# Patient Record
Sex: Male | Born: 1955 | Race: White | Hispanic: No | Marital: Married | Smoking: Former smoker
Health system: Southern US, Community
[De-identification: ages and names within clinical notes are randomized; demographics above are authoritative.]

## PROBLEM LIST (undated history)

## (undated) DIAGNOSIS — I739 Peripheral vascular disease, unspecified: Secondary | ICD-10-CM

## (undated) DIAGNOSIS — F329 Major depressive disorder, single episode, unspecified: Secondary | ICD-10-CM

## (undated) DIAGNOSIS — R262 Difficulty in walking, not elsewhere classified: Secondary | ICD-10-CM

## (undated) DIAGNOSIS — B182 Chronic viral hepatitis C: Secondary | ICD-10-CM

## (undated) DIAGNOSIS — M6281 Muscle weakness (generalized): Secondary | ICD-10-CM

## (undated) DIAGNOSIS — L89159 Pressure ulcer of sacral region, unspecified stage: Secondary | ICD-10-CM

## (undated) DIAGNOSIS — K5900 Constipation, unspecified: Secondary | ICD-10-CM

## (undated) DIAGNOSIS — J9809 Other diseases of bronchus, not elsewhere classified: Secondary | ICD-10-CM

## (undated) DIAGNOSIS — R293 Abnormal posture: Secondary | ICD-10-CM

## (undated) DIAGNOSIS — M6250 Muscle wasting and atrophy, not elsewhere classified, unspecified site: Secondary | ICD-10-CM

## (undated) DIAGNOSIS — G7109 Other specified muscular dystrophies: Secondary | ICD-10-CM

## (undated) HISTORY — DX: Chronic viral hepatitis C: B18.2

## (undated) HISTORY — DX: Major depressive disorder, single episode, unspecified: F32.9

## (undated) HISTORY — DX: Muscle wasting and atrophy, not elsewhere classified, unspecified site: M62.50

## (undated) HISTORY — DX: Peripheral vascular disease, unspecified: I73.9

## (undated) HISTORY — DX: Other diseases of bronchus, not elsewhere classified: J98.09

## (undated) HISTORY — DX: Constipation, unspecified: K59.00

## (undated) HISTORY — DX: Other specified muscular dystrophies: G71.09

## (undated) HISTORY — DX: Muscle weakness (generalized): M62.81

## (undated) HISTORY — DX: Difficulty in walking, not elsewhere classified: R26.2

## (undated) HISTORY — DX: Pressure ulcer of sacral region, unspecified stage: L89.159

## (undated) HISTORY — DX: Abnormal posture: R29.3

---

## 1987-06-15 HISTORY — PX: ROTATOR CUFF REPAIR: SHX139

## 1990-06-14 HISTORY — PX: APPENDECTOMY: SHX54

## 2011-11-10 ENCOUNTER — Ambulatory Visit: Payer: Self-pay | Admitting: Specialist

## 2014-06-14 HISTORY — PX: OTHER SURGICAL HISTORY: SHX169

## 2015-07-15 ENCOUNTER — Ambulatory Visit: Payer: Medicare Other | Admitting: Neurology

## 2015-07-22 ENCOUNTER — Ambulatory Visit: Payer: Medicare Other | Admitting: Neurology

## 2015-08-07 ENCOUNTER — Ambulatory Visit: Payer: Medicare Other | Admitting: Neurology

## 2015-08-07 ENCOUNTER — Telehealth: Payer: Self-pay

## 2015-08-07 NOTE — Telephone Encounter (Signed)
Facility patient lives in called to cancel appointment about 45 minutes prior to appointment time. They state the patient refuses to come.

## 2015-08-12 ENCOUNTER — Encounter: Payer: Self-pay | Admitting: Neurology

## 2016-08-16 DIAGNOSIS — Z86711 Personal history of pulmonary embolism: Secondary | ICD-10-CM | POA: Diagnosis not present

## 2016-08-16 DIAGNOSIS — Z7901 Long term (current) use of anticoagulants: Secondary | ICD-10-CM

## 2016-08-16 DIAGNOSIS — J9601 Acute respiratory failure with hypoxia: Secondary | ICD-10-CM | POA: Diagnosis not present

## 2016-08-16 DIAGNOSIS — G710 Muscular dystrophy: Secondary | ICD-10-CM | POA: Diagnosis not present

## 2016-08-16 DIAGNOSIS — L89159 Pressure ulcer of sacral region, unspecified stage: Secondary | ICD-10-CM | POA: Diagnosis not present

## 2016-08-16 DIAGNOSIS — J9809 Other diseases of bronchus, not elsewhere classified: Secondary | ICD-10-CM | POA: Diagnosis not present

## 2016-08-17 DIAGNOSIS — Z86711 Personal history of pulmonary embolism: Secondary | ICD-10-CM | POA: Diagnosis not present

## 2016-08-17 DIAGNOSIS — L89159 Pressure ulcer of sacral region, unspecified stage: Secondary | ICD-10-CM | POA: Diagnosis not present

## 2016-08-17 DIAGNOSIS — Z7901 Long term (current) use of anticoagulants: Secondary | ICD-10-CM | POA: Diagnosis not present

## 2016-08-17 DIAGNOSIS — G710 Muscular dystrophy: Secondary | ICD-10-CM | POA: Diagnosis not present

## 2016-08-19 ENCOUNTER — Encounter: Payer: Self-pay | Admitting: Neurology

## 2016-08-19 ENCOUNTER — Ambulatory Visit (INDEPENDENT_AMBULATORY_CARE_PROVIDER_SITE_OTHER): Payer: Medicare Other | Admitting: Neurology

## 2016-08-19 ENCOUNTER — Telehealth: Payer: Self-pay | Admitting: Neurology

## 2016-08-19 VITALS — BP 100/78 | HR 106 | Ht 72.0 in

## 2016-08-19 DIAGNOSIS — R1312 Dysphagia, oropharyngeal phase: Secondary | ICD-10-CM | POA: Diagnosis not present

## 2016-08-19 DIAGNOSIS — G710 Muscular dystrophy: Secondary | ICD-10-CM

## 2016-08-19 DIAGNOSIS — G71039 Limb girdle muscular dystrophy, unspecified: Secondary | ICD-10-CM

## 2016-08-19 DIAGNOSIS — G7109 Other specified muscular dystrophies: Secondary | ICD-10-CM

## 2016-08-19 HISTORY — DX: Other specified muscular dystrophies: G71.09

## 2016-08-19 HISTORY — DX: Limb girdle muscular dystrophy, unspecified: G71.039

## 2016-08-19 NOTE — Progress Notes (Signed)
Reason for visit: Limb-girdle dystrophy  Referring physician: Dr. Oralia Rud is a 61 y.o. male  History of present illness:  George Evans is a 61 year old right-handed white male with a history of progressive muscular weakness that began in 2005 after he was treated with gamma interferon for hepatitis C viral infection. George Evans did well with George hepatitis C infection, but he began having onset of arm weakness in 2005, and by 2010 he lost his ability to ambulate effectively. He has continued to progress since that time, he has weakness on George face, neck muscles, arms and legs, he is nonambulatory. George Evans has no numbness of George extremities, he denies issues controlling George bowels or George bladder. He has lost his ability to feed himself effectively, he has reported some difficulty with choking with swallowing both liquids and solids. He has had recent episodes of choking at night with phlegm, unable to clear George phlegm with coughing. He does report some low back pain and hip discomfort. He has not had any recent falls or issues with skin breakdown. 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 He had genetic testing for these entities, George actual reports are not available to me. George Evans apparently has an uncle and 2 cousins with myotonic dystrophy. George Evans does not know what type of limb-girdle muscular dystrophy that he has.  Past Medical History:  Diagnosis Date  . Abnormal posture   . Chronic viral hepatitis C (HCC)   . Constipation   . Difficulty walking   . Distal muscular dystrophy (HCC)   . 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 Major depressive disorder   . Muscle wasting   . Muscle weakness   . Other diseases of bronchus, not elsewhere classified (CODE)   . Peripheral vascular disease (HCC)   . Pressure ulcer of sacral region     Past Surgical History:    Procedure Laterality Date  . APPENDECTOMY  1992  . OTHER SURGICAL HISTORY  2016   Abcess removed from penis  . ROTATOR CUFF REPAIR Right 1989    Family History  Problem Relation Age of Onset  . Cancer Sister     thyroid  . Cancer Sister     thyroid    Social history:  reports that he quit smoking about 7 years ago. He has never used smokeless tobacco. He reports that he does not drink alcohol or use drugs.  Medications:  Prior to Admission medications   Medication Sig Start Date End Date Taking? Authorizing Provider  glycopyrrolate (ROBINUL) 1 MG tablet Take 1 mg by mouth 3 (three) times daily.   Yes Historical Provider, MD  GUAIFENESIN PO Take 10 mLs by mouth every 6 (six) hours as needed.   Yes Historical Provider, MD  rivaroxaban (XARELTO) 20 MG TABS tablet Take 20 mg by mouth daily.   Yes Historical Provider, MD      Allergies  Allergen Reactions  . Coconut Oil Swelling    ROS:  Out of a complete 14 system review of symptoms, George Evans complains only of George following symptoms, and all other reviewed systems are negative.  Swelling in George legs Ringing in ears, difficulty swallowing Moles Shortness of breath, cough, snoring Joint pain, muscle cramps, aching muscles Runny nose Weakness, difficulty swallowing Depression, not enough sleep  Blood pressure 100/78, pulse (!) 106, height 6' (1.829 m), SpO2 93 %.  Physical Exam  General: George Evans is alert and cooperative at George time of George examination.  Eyes: Pupils are equal, round, and reactive to light. Discs are flat bilaterally.  Neck: George neck is supple, no carotid bruits are noted.  Respiratory: George respiratory examination is clear.  Cardiovascular: George cardiovascular examination reveals a regular rate and rhythm, no obvious murmurs or rubs are noted.  Skin: Extremities are without significant edema.  Neurologic Exam  Mental status: George Evans is alert and oriented x 3 at George time of George  examination. George Evans has apparent normal recent and remote memory, with an apparently normal attention span and concentration ability.  Cranial nerves: Facial symmetry is present. There is good sensation of George face to pinprick and soft touch bilaterally. George strength of George facial muscles are diffusely weak, he has weakness with head turning and shoulder shrug bilaterally. Speech is well enunciated, no aphasia or dysarthria is noted. Extraocular movements are full. Visual fields are full. George tongue is midline, and George Evans has symmetric elevation of George soft palate. No obvious hearing deficits are noted.  Motor: George motor testing reveals diffuse weakness on all 4 extremities, 3/5 strength with George intrinsic muscles of George hands, 2/5 strength proximally, George Evans has weakness with neck flexion and extension. With George lower extremities, there is diffuse one or 2/5 strength throughout, bilateral foot drops are noted. Good symmetric motor tone is noted throughout.  Sensory: Sensory testing is intact to pinprick, soft touch, vibration sensation, and position sense on all 4 extremities. No evidence of extinction is noted.  Coordination: Cerebellar testing reveals that George Evans is unable to perform George nose finger and heel-to-shin because of muscular weakness.  Gait and station: Gait is could not be tested, George Evans is wheelchair-bound.  Reflexes: Deep tendon reflexes are symmetric, but are depressed bilaterally. Toes are downgoing bilaterally.   Assessment/Plan:  1. Limb-girdle muscular dystrophy  George Evans has been told that he has both limb-girdle dystrophy and myotonic dystrophy, but prior EMG studies have not shown myotonia. George Evans has had a muscle biopsy that showed nonspecific changes. He apparently has had genetic testing for myotonic dystrophy and for limb-girdle muscular dystrophy, George actual results of this are not available to me. At any rate, George Evans has had a  devastating progression of generalized weakness that appears to be compromising his ability to swallow and breathe at this time. George Evans will be sent for a modified barium swallow and he will be sent for pulmonary consultation to evaluate his pulmonary function tests and to determine if and when he will require CPAP or BiPAP. George Evans is getting physical and occupational therapy. He is being considered for a standing frame which is reasonable at this time. He will follow-up in 6 months.  Marlan Palau MD 08/19/2016 12:18 PM  [facility] [address] [place], [place] [address]  Phone [phone] Fax [phone]

## 2016-08-19 NOTE — Telephone Encounter (Signed)
Kara Mead, Will you reorder this for Dr. Anne Hahn for [facility] order number [id number]. SLP MODIFIED BARIUM SWALLOW . Thanks Annabelle Harman.

## 2016-08-24 NOTE — Telephone Encounter (Signed)
Patient order was faxed to [facility] . [place] Telephone [phone]- fax [phone].

## 2016-08-25 ENCOUNTER — Other Ambulatory Visit (HOSPITAL_COMMUNITY): Payer: Self-pay | Admitting: Neurology

## 2016-08-25 DIAGNOSIS — R1319 Other dysphagia: Secondary | ICD-10-CM

## 2016-09-01 ENCOUNTER — Ambulatory Visit (HOSPITAL_COMMUNITY): Payer: Medicare Other

## 2016-09-07 ENCOUNTER — Telehealth: Payer: Self-pay | Admitting: Neurology

## 2016-09-07 NOTE — Telephone Encounter (Signed)
This patient is undergone a modified barium swallow that shows high-risk for aspiration, the patient has oral pharyngeal dysfunction. The patient did not have aspiration on the study, but he was felt to be high risk. Speech therapy for dysphagia was recommended. The patient was told to take small sips and bites, eats slowly. Alternate liquids and solids. It only in an upright position, stay upright for 30 minutes after meals. Use multiple swallows with each bite.

## 2016-09-14 ENCOUNTER — Non-Acute Institutional Stay (SKILLED_NURSING_FACILITY): Payer: Medicare Other | Admitting: Internal Medicine

## 2016-09-14 ENCOUNTER — Ambulatory Visit (INDEPENDENT_AMBULATORY_CARE_PROVIDER_SITE_OTHER): Payer: Medicare Other | Admitting: Pulmonary Disease

## 2016-09-14 ENCOUNTER — Encounter: Payer: Self-pay | Admitting: Pulmonary Disease

## 2016-09-14 ENCOUNTER — Encounter: Payer: Self-pay | Admitting: Internal Medicine

## 2016-09-14 ENCOUNTER — Institutional Professional Consult (permissible substitution): Payer: Medicare Other | Admitting: Pulmonary Disease

## 2016-09-14 VITALS — BP 106/74 | HR 126 | Ht 72.0 in | Wt 155.0 lb

## 2016-09-14 DIAGNOSIS — J9610 Chronic respiratory failure, unspecified whether with hypoxia or hypercapnia: Secondary | ICD-10-CM | POA: Diagnosis not present

## 2016-09-14 DIAGNOSIS — G7109 Other specified muscular dystrophies: Secondary | ICD-10-CM

## 2016-09-14 DIAGNOSIS — G71039 Limb girdle muscular dystrophy, unspecified: Secondary | ICD-10-CM

## 2016-09-14 DIAGNOSIS — G710 Muscular dystrophy: Secondary | ICD-10-CM | POA: Diagnosis not present

## 2016-09-14 DIAGNOSIS — F329 Major depressive disorder, single episode, unspecified: Secondary | ICD-10-CM | POA: Diagnosis not present

## 2016-09-14 DIAGNOSIS — F0631 Mood disorder due to known physiological condition with depressive features: Secondary | ICD-10-CM

## 2016-09-14 DIAGNOSIS — J9690 Respiratory failure, unspecified, unspecified whether with hypoxia or hypercapnia: Secondary | ICD-10-CM | POA: Insufficient documentation

## 2016-09-14 NOTE — Progress Notes (Signed)
This is a comprehensive admission note to [facility] performed on this date less than 30 days from date of admission. Included are preadmission medical/surgical history;reconciled medication list; family history; social history and comprehensive review of systems.  Corrections and additions to the records were documented . Comprehensive physical exam was also performed. Additionally a clinical summary was entered for each active diagnosis pertinent to this admission in the Problem List to enhance continuity of care.  PCP: none  HPI: He was admitted to this SNF for long term care from another facility. He has progressive muscle weakness as well as a history of multiple pulmonary thromboemboli with associated respiratory compromise. Symptoms began in 2005 after gamma interferon treatment for hepatitis C. At [facility] and was diagnosed with myotonic dystrophy and limb-girdle muscular dystrophy. Genetic testing was done because of his strong family history of myotonic dystrophy in uncle & 2 cousins. His Neurologist Dr. Anne Hahn ordered a modified barium swallow which reveals high risk for aspiration with oropharyngeal dysfunction.Actual aspiration was not observed on the modified study.Speech  Therapy is working with the patient for dysphagia.  The patient had been hospitalized  for dyspnea in the context of retained mucus plugging. He had severe hypoxia with a PO2 of 62 on 6 L. He received aggressive pulmonary toilet to include nebulizers, suctioning, and chest physical therapy enabling being weaned off oxygen. Because of the muscular disorder he is confined to a wheelchair. Past medical and surgical history:Other diagnoses include pressure ulcer of the sacral area, peripheral vascular disease, and depression. The patient quit smoking in 2011.  Social history &Family history: reviewed  Review of systems: He states that he stays cold. He has edema of the lower extremities if he sits  up all day He describes scant sputum production. He states the shortness of breath is stable. Constitutional: No fever,significant weight change, fatigue  Eyes: No redness, discharge, pain, vision change ENT/mouth: No nasal congestion,  purulent discharge, earache,change in hearing ,sore throat  Cardiovascular: No chest pain, palpitations,paroxysmal nocturnal dyspnea, claudication  Respiratory: No hemoptysis,  significant snoring,apnea  Gastrointestinal: No heartburn,dysphagia,abdominal pain, nausea / vomiting,rectal bleeding, melena,change in bowels Genitourinary: No dysuria,hematuria, pyuria,  incontinence, nocturia Musculoskeletal: No joint stiffness, joint swelling, weakness,pain Dermatologic: No rash, pruritus, change in appearance of skin Neurologic: No dizziness,headache,syncope, seizures, numbness , tingling Psychiatric: No significant anxiety , depression, insomnia, anorexia Endocrine: No change in hair/skin/ nails, excessive thirst, excessive hunger, excessive urination  Hematologic/lymphatic: No significant bruising, lymphadenopathy,abnormal bleeding Allergy/immunology: No itchy/ watery eyes, significant sneezing, urticaria, angioedema  Physical exam:  Pertinent or positive findings: He has visible wasting of temporal musculature. Completely edentulous. His voice is somewhat of a raspy whisper. Affect is markedly flat. Arcus senilis is noted. An S4 gallop is suggested. Breath sounds are decreased. He does have some minor rhonchi anteriorly. Pedal pulses are decreased. Clubbing of the nailbeds is noted.  General appearance: no acute distress , increased work of breathing is present.   Lymphatic: No lymphadenopathy about the head, neck, axilla . Eyes: No conjunctival inflammation or lid edema is present. There is no scleral icterus. Ears:  External ear exam shows no significant lesions or deformities.   Nose:  External nasal examination shows no deformity or inflammation. Nasal  mucosa are pink and moist without lesions ,exudates Oral exam: lips and gums are healthy appearing.There is no oropharyngeal erythema or exudate . Neck:  No thyromegaly, masses, tenderness noted.    Heart:  No murmur, click, rub .  Abdomen:Bowel sounds are normal. Abdomen is soft and nontender with no organomegaly, hernias,masses. GU: deferred  Extremities:  No cyanosis, edema  Neurologic exam : Balance,Rhomberg,finger to nose testing could not be completed due to clinical state Skin: Warm & dry w/o tenting. No significant lesions or rash.  See clinical summary under each active problem in the Problem List with associated updated therapeutic plan [facility]

## 2016-09-14 NOTE — Patient Instructions (Signed)
We will schedule you for PFTs with vital capacity, MIP, MEP, ABG Arrange for a smart vest  Return in 1-2 months

## 2016-09-14 NOTE — Patient Instructions (Signed)
See assessment and plan under each diagnosis in the problem list and acutely for this visit 

## 2016-09-14 NOTE — Assessment & Plan Note (Addendum)
Chest PT , Smart Vest Long term trach may be necessary for secretion control

## 2016-09-14 NOTE — Assessment & Plan Note (Signed)
As per Dr Willis 

## 2016-09-14 NOTE — Assessment & Plan Note (Addendum)
09/14/16 at this time patient denies depression which is not realistic in view of his serious neuromuscular process with complications; perhaps with time he will consider SSRI

## 2016-09-14 NOTE — Progress Notes (Signed)
George Evans    [id number]    09-29-1955  Primary Care Physician:WILLIS,CHARLES KEITH, MD  Referring Physician: [name], MD [address] [place], [place] [address]  Chief complaint:  Consult for evaluation of respiratory function ins setting of progressive neuromuscular weakness  HPI: George Evans is a 61 year old with history of progressive muscle weakness, multiple PEs on anticoagulation. His problem started after 2005 after gamma interferon treatment for hepatitis C. 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 There is a strong family history of myotonic dystrophy with an uncle and 2 cousins effected with it. He was evaluated by Dr. Anne Hahn at [facility] neurology and had a modified barium swallow which shows high risk for aspiration with oropharyngeal dysfunction although actual aspiration was not observed. He is currently in speech therapy for dysphagia.   He was hospitalized in March at [facility] for mucous plug, hypoxia, dyspnea. He was admitted for a day and half but did not need positive pressure ventilation or intubation. As per the discharge summary his labs including CBC, d-dimer, CMP, troponin, BNP was negative. ABG showed 7.41/42/62 on 6 L. He was treated with chest PT, NP suctioning, nebulizers, Mucomyst.Copious amounts of secretion noted by respiratory therapist. He was ultimately weaned off oxygen  He is currently resident at [facility] nursing home and is confined to a wheelchair. He denies any cough, mucus production, chest congestion, dyspnea, wheezing  Outpatient Encounter Prescriptions as of 09/14/2016  Medication Sig  . glycopyrrolate (ROBINUL) 1 MG tablet Take 1 mg by mouth 3 (three) times daily.  . ondansetron (ZOFRAN) 4 MG tablet Take 4 mg by mouth every 8 (eight) hours as needed for nausea or vomiting.  . rivaroxaban (XARELTO) 20 MG  TABS tablet Take 20 mg by mouth daily.  . [DISCONTINUED] GUAIFENESIN PO Take 10 mLs by mouth every 6 (six) hours as needed.   No facility-administered encounter medications on file as of 09/14/2016.     Allergies as of 09/14/2016 - Review Complete 09/14/2016  Allergen Reaction Noted  . Coconut oil Swelling     Past Medical History:  Diagnosis Date  . Abnormal posture   . Chronic viral hepatitis C (HCC)   . Constipation   . Difficulty walking   . Distal muscular dystrophy (HCC)   . 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 Major depressive disorder   . Muscle wasting   . Muscle weakness   . Other diseases of bronchus, not elsewhere classified (CODE)   . Peripheral vascular disease (HCC)   . Pressure ulcer of sacral region     Past Surgical History:  Procedure Laterality Date  . APPENDECTOMY  1992  . OTHER SURGICAL HISTORY  2016   Abcess removed from penis  . ROTATOR CUFF REPAIR Right 1989    Family History  Problem Relation Age of Onset  . Diabetes Mother   . Cancer Sister     thyroid  . Cancer Sister     thyroid    Social History   Social History  . Marital status: Married    Spouse name: N/A  . Number of children: 0  . Years of education: 10   Occupational History  . Retired     Social History Main Topics  . Smoking status: Former Smoker    Packs/day: 2.00    Years: 40.00    Quit date:  06/14/2009  . Smokeless tobacco: Never Used  . Alcohol use No  . Drug use: No  . Sexual activity: Not on file   Other Topics Concern  . Not on file   Social History Narrative   Lives at [facility]   Phone: [phone]   Caffeine use: Coffee/tea daily   Right-handed     Review of systems: Review of Systems  Constitutional: Negative for fever and chills.  HENT: Negative.   Eyes: Negative for blurred vision.  Respiratory: as per HPI  Cardiovascular: Negative for chest pain and palpitations.  Gastrointestinal: Negative for vomiting, diarrhea,  blood per rectum. Genitourinary: Negative for dysuria, urgency, frequency and hematuria.  Musculoskeletal: Negative for myalgias, back pain and joint pain.  Skin: Negative for itching and rash.  Neurological: Negative for dizziness, tremors, focal weakness, seizures and loss of consciousness.  Endo/Heme/Allergies: Negative for environmental allergies.  Psychiatric/Behavioral: Negative for depression, suicidal ideas and hallucinations.  All other systems reviewed and are negative.  Physical Exam: Blood pressure 106/74, pulse (!) 126, height 6' (1.829 m), weight 155 lb (70.3 kg), SpO2 98 %. Gen:      No acute distress HEENT:  EOMI, sclera anicteric Neck:     No masses; no thyromegaly Lungs:    Clear to auscultation bilaterally; normal respiratory effort CV:         Regular rate and rhythm; no murmurs Abd:      + bowel sounds; soft, non-tender; no palpable masses, no distension Ext:    No edema; adequate peripheral perfusion Skin:      Warm and dry; no rash Neuro: alert and oriented x 3 Psych: normal mood and affect  Data Reviewed: Data from [facility] hospital March 2018 Chest x-ray 08/16/16-no active disease. 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 He is maintained on Robinul and guaifenasin which appears to be working. He has not tolerated Mucomyst in the past. I will arrange for a smart vest as he would not be able to use a flutter valve effectively due to issues with dexterity  We'll check pulmonary function tests with vital capacity, maximum inspiratory pressure, maximum expiratory pressure and ABG. Continue to monitor his respiratory function. He may end up needing home vent at night via mask and ultimately a tracheostomy due to his progressive  neuromuscular weakness. We will contact [facility] to see if resp support with chest PT and NT suctioning can be arranged  Plan/Recommendations: - Smart vest for clearance of secretions - PFTs - Try to arrange chest PT, NT suctioning.    Chilton Greathouse MD [facility] Pager [phone] 09/14/2016, 1:43 PM  CC: [name], MD

## 2016-09-27 ENCOUNTER — Encounter (INDEPENDENT_AMBULATORY_CARE_PROVIDER_SITE_OTHER): Payer: Self-pay

## 2016-09-27 ENCOUNTER — Ambulatory Visit (HOSPITAL_COMMUNITY)
Admission: RE | Admit: 2016-09-27 | Discharge: 2016-09-27 | Disposition: A | Discharge status: Home / Self Care | Payer: Medicare Other | Source: Ambulatory Visit | Attending: Pulmonary Disease | Admitting: Pulmonary Disease

## 2016-09-27 DIAGNOSIS — G710 Muscular dystrophy: Secondary | ICD-10-CM | POA: Insufficient documentation

## 2016-09-27 DIAGNOSIS — G7109 Other specified muscular dystrophies: Secondary | ICD-10-CM

## 2016-09-27 DIAGNOSIS — G71039 Limb girdle muscular dystrophy, unspecified: Secondary | ICD-10-CM

## 2016-09-27 LAB — BLOOD GAS, ARTERIAL
Acid-Base Excess: 1.8 mmol/L (ref 0.0–2.0)
BICARBONATE: 25.9 mmol/L (ref 20.0–28.0)
DRAWN BY: 21179
FIO2: 0.21
O2 Saturation: 96.2 %
PH ART: 7.42 (ref 7.350–7.450)
Patient temperature: 98.6
pCO2 arterial: 40.7 mmHg (ref 32.0–48.0)
pO2, Arterial: 82.1 mmHg — ABNORMAL LOW (ref 83.0–108.0)

## 2016-09-27 LAB — PULMONARY FUNCTION TEST
DL/VA % PRED: 70 %
DL/VA: 3.32 ml/min/mmHg/L
DLCO COR % PRED: 28 %
DLCO UNC % PRED: 28 %
DLCO cor: 10.04 ml/min/mmHg
DLCO unc: 10.09 ml/min/mmHg
FEF 25-75 PRE: 2.55 L/s
FEF 25-75 Post: 2.36 L/sec
FEF2575-%Change-Post: -7 %
FEF2575-%PRED-POST: 77 %
FEF2575-%Pred-Pre: 84 %
FEV1-%CHANGE-POST: -2 %
FEV1-%Pred-Post: 38 %
FEV1-%Pred-Pre: 39 %
FEV1-PRE: 1.49 L
FEV1-Post: 1.45 L
FEV1FVC-%Change-Post: -1 %
FEV1FVC-%PRED-PRE: 128 %
FEV6-%Change-Post: 0 %
FEV6-%Pred-Post: 31 %
FEV6-%Pred-Pre: 32 %
FEV6-POST: 1.53 L
FEV6-Pre: 1.54 L
FEV6FVC-%PRED-POST: 105 %
FEV6FVC-%Pred-Pre: 105 %
FVC-%Change-Post: 0 %
FVC-%Pred-Post: 30 %
FVC-%Pred-Pre: 30 %
FVC-Post: 1.53 L
FVC-Pre: 1.54 L
POST FEV6/FVC RATIO: 100 %
PRE FEV1/FVC RATIO: 97 %
PRE FEV6/FVC RATIO: 100 %
Post FEV1/FVC ratio: 95 %

## 2016-09-27 MED ORDER — ALBUTEROL SULFATE (2.5 MG/3ML) 0.083% IN NEBU
2.5000 mg | INHALATION_SOLUTION | Freq: Once | RESPIRATORY_TRACT | Status: AC
  Administered 2016-09-27: 2.5 mg via RESPIRATORY_TRACT

## 2016-10-04 ENCOUNTER — Institutional Professional Consult (permissible substitution): Payer: Medicare Other | Admitting: Internal Medicine

## 2016-10-13 ENCOUNTER — Encounter: Payer: Self-pay | Admitting: Internal Medicine

## 2016-10-13 NOTE — Progress Notes (Signed)
This encounter was created in error - please disregard.

## 2016-11-03 ENCOUNTER — Telehealth: Payer: Self-pay

## 2016-11-03 ENCOUNTER — Encounter: Payer: Self-pay | Admitting: Pulmonary Disease

## 2016-11-03 ENCOUNTER — Ambulatory Visit (INDEPENDENT_AMBULATORY_CARE_PROVIDER_SITE_OTHER): Payer: Medicare Other | Admitting: Pulmonary Disease

## 2016-11-03 VITALS — BP 104/70 | HR 125 | Ht 72.0 in

## 2016-11-03 DIAGNOSIS — J9610 Chronic respiratory failure, unspecified whether with hypoxia or hypercapnia: Secondary | ICD-10-CM

## 2016-11-03 NOTE — Telephone Encounter (Signed)
During pt's OV with PM on 09/14/16 smart vest was ordered, however insurance will not cover due to pt being at a facility. PM ask that we contact [facility] and ask about chest PT & percussion vest. I have spoken with Carollee Herter at [facility], who has taken a message for the nurse to call back.

## 2016-11-03 NOTE — Patient Instructions (Addendum)
We will try to arrange a trelegy ventilator We will get in touch with [facility] to see if the chest PT, percussion vest can be arranged next  Return to clinic in 3 months

## 2016-11-03 NOTE — Progress Notes (Signed)
George Evans    [id number]    1956/02/13  Primary Care Physician:Willis, Hennie Duos, MD  Referring Physician: York Spaniel, MD 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 George Evans is a 61 year old with history of progressive muscle weakness, multiple PEs on anticoagulation. His problem started after 2005 after gamma interferon treatment for hepatitis C. 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 There is a strong family history of myotonic dystrophy with an uncle and 2 cousins effected with it. He was evaluated by Dr. Anne Hahn at [facility] neurology and had a modified barium swallow which shows high risk for aspiration with oropharyngeal dysfunction although actual aspiration was not observed. He is currently in speech therapy for dysphagia.   He was hospitalized in March at [facility] for mucous plug, hypoxia, dyspnea. He was admitted for a day and half but did not need positive pressure ventilation or intubation. As per the discharge summary his labs including CBC, d-dimer, CMP, troponin, BNP was negative. ABG showed 7.41/42/62 on 6 L. He was treated with chest PT, NP suctioning, nebulizers, Mucomyst.Copious amounts of secretion noted by respiratory therapist. He was ultimately weaned off oxygen  He is currently resident at [facility] nursing home and is confined to a wheelchair.   Interim history: Reports slightly worse muscle strength. Does not report any cough, sputum production, fevers, chills, dyspnea, wheezing.  Outpatient Encounter Prescriptions as of 11/03/2016  Medication Sig  . glycopyrrolate (ROBINUL) 1 MG tablet Take 1 mg by mouth 3 (three) times daily.  Marland Kitchen guaifenesin (ROBITUSSIN) 100  MG/5ML syrup Take 200 mg by mouth every 6 (six) hours as needed for cough.  . ondansetron (ZOFRAN) 4 MG tablet Take 4 mg by mouth every 8 (eight) hours as needed for nausea or vomiting.  . rivaroxaban (XARELTO) 20 MG TABS tablet Take 20 mg by mouth daily.  Marland Kitchen senna (SENOKOT) 8.6 MG tablet Take 2 tablets by mouth at bedtime. Hold for loose stools   No facility-administered encounter medications on file as of 11/03/2016.     Allergies as of 11/03/2016 - Review Complete 11/03/2016  Allergen Reaction Noted  . Coconut oil Swelling     Past Medical History:  Diagnosis Date  . Abnormal posture   . Chronic viral hepatitis C (HCC)   . Constipation   . Difficulty walking   . Distal muscular dystrophy (HCC)   . 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 Major depressive disorder   . Muscle wasting   . Muscle weakness   . Other diseases of bronchus, not elsewhere classified (CODE)   . Peripheral vascular disease (HCC)   . Pressure ulcer of sacral region     Past Surgical History:  Procedure Laterality Date  . APPENDECTOMY  1992  . OTHER SURGICAL HISTORY  2016   Abcess removed from penis  . ROTATOR CUFF REPAIR Right 1989    Family History  Problem Relation Age of Onset  . Diabetes Mother   . Cancer Sister        thyroid  . Cancer Sister        thyroid    Social History   Social History  . Marital status: Married    Spouse name: N/A  .  Number of children: 0  . Years of education: 10   Occupational History  . Retired     Social History Main Topics  . Smoking status: Former Smoker    Packs/day: 2.00    Years: 40.00    Quit date: 06/14/2009  . Smokeless tobacco: Never Used  . Alcohol use No  . Drug use: No  . Sexual activity: Not on file   Other Topics Concern  . Not on file   Social History Narrative   Lives at [facility]   Phone: [phone]   Caffeine use: Coffee/tea daily   Right-handed     Review of systems: Review of Systems    Constitutional: Negative for fever and chills.  HENT: Negative.   Eyes: Negative for blurred vision.  Respiratory: as per HPI  Cardiovascular: Negative for chest pain and palpitations.  Gastrointestinal: Negative for vomiting, diarrhea, blood per rectum. Genitourinary: Negative for dysuria, urgency, frequency and hematuria.  Musculoskeletal: Negative for myalgias, back pain and joint pain.  Skin: Negative for itching and rash.  Neurological: Negative for dizziness, tremors, focal weakness, seizures and loss of consciousness.  Endo/Heme/Allergies: Negative for environmental allergies.  Psychiatric/Behavioral: Negative for depression, suicidal ideas and hallucinations.  All other systems reviewed and are negative.  Physical Exam: Blood pressure 104/70, pulse (!) 125, height 6' (1.829 m), SpO2 100 %. Gen:      No acute distress HEENT:  EOMI, sclera anicteric Neck:     No masses; no thyromegaly Lungs:    Clear to auscultation bilaterally; normal respiratory effort CV:         Regular rate and rhythm; no murmurs Abd:      + bowel sounds; soft, non-tender; no palpable masses, no distension Ext:    No edema; adequate peripheral perfusion Skin:      Warm and dry; no rash Neuro: alert and oriented x 3 Psych: normal mood and affect  Data Reviewed: Data from [facility] hospital March 2018 Chest x-ray 08/16/16-no active disease. ABG 7.41/42/62 on 6 L nasal cannula Blood cultures negative Sputum cultures normal oral/respiratory flora  PFTs 09/27/16 FVC 1.53 (30%) FEV1 1.45 (38%) F/F 95 SVC 31% DLCO 28% Severe restriction and diffusion defect. MIP and MEP are severely reduced ABG 09/27/16- 7.42/41/82 on room air  Assessment:  Respiratory failure secondary to neuromuscular weakness  He had a admission in early 2018 for hypoxia secondary to mucous plugs with inability to clear secretions. He is maintained on Robinul and guaifenasin which appears to be working. He has not tolerated Mucomyst  in the past.   He will need a smart vest as he would not be able to use a flutter valve effectively due to issues with dexterity. As per the DME company this cannot be arranged while he is in an institution such as [facility]. He'll also benefit from a home ventilator such as Trilogy at least at night, due to severe reduction in lung function. I had a prolonged, frank discussion with the patient and his wife. Our expectation is that he'll get progressively weak and will likely end up needing a tracheostomy with vent 24/7. There are aware of this and wanted take one step at a time.  My office will contact [facility] to determine what needs to be done to get him started on chest PT, suctioning, smart vest and Trilogy vent in that institution.  PEs Continue xarelto. He will need life long anticoagulation  Dysphagia Aspiration precautions  Plan/Recommendations: - Smart vest, Trilogy vent -  Try to arrange chest PT, NT suctioning.  - Contact [facility] to get this arranged.    More then 1/2 the time of the 40 min visit was spent in counseling and/or coordination of care with the patient and family.  Chilton Greathouse MD [facility] Pager [phone] 11/03/2016, 11:21 AM  CC: York Spaniel, MD

## 2016-11-27 ENCOUNTER — Inpatient Hospital Stay (HOSPITAL_COMMUNITY)
Admission: EM | Admit: 2016-11-27 | Discharge: 2016-11-30 | DRG: 206 | Disposition: A | Discharge status: Skilled Nursing Facility | Payer: Medicare Other | Attending: Family Medicine | Admitting: Family Medicine

## 2016-11-27 ENCOUNTER — Encounter (HOSPITAL_COMMUNITY): Payer: Self-pay | Admitting: Emergency Medicine

## 2016-11-27 ENCOUNTER — Emergency Department (HOSPITAL_COMMUNITY): Payer: Medicare Other

## 2016-11-27 DIAGNOSIS — J9610 Chronic respiratory failure, unspecified whether with hypoxia or hypercapnia: Secondary | ICD-10-CM

## 2016-11-27 DIAGNOSIS — G710 Muscular dystrophy: Secondary | ICD-10-CM | POA: Diagnosis present

## 2016-11-27 DIAGNOSIS — X58XXXA Exposure to other specified factors, initial encounter: Secondary | ICD-10-CM | POA: Diagnosis present

## 2016-11-27 DIAGNOSIS — G35 Multiple sclerosis: Secondary | ICD-10-CM | POA: Diagnosis present

## 2016-11-27 DIAGNOSIS — I739 Peripheral vascular disease, unspecified: Secondary | ICD-10-CM | POA: Diagnosis present

## 2016-11-27 DIAGNOSIS — Z833 Family history of diabetes mellitus: Secondary | ICD-10-CM | POA: Diagnosis not present

## 2016-11-27 DIAGNOSIS — T17990A Other foreign object in respiratory tract, part unspecified in causing asphyxiation, initial encounter: Principal | ICD-10-CM | POA: Diagnosis present

## 2016-11-27 DIAGNOSIS — T17308A Unspecified foreign body in larynx causing other injury, initial encounter: Secondary | ICD-10-CM | POA: Insufficient documentation

## 2016-11-27 DIAGNOSIS — M6281 Muscle weakness (generalized): Secondary | ICD-10-CM | POA: Insufficient documentation

## 2016-11-27 DIAGNOSIS — T17908A Unspecified foreign body in respiratory tract, part unspecified causing other injury, initial encounter: Secondary | ICD-10-CM | POA: Diagnosis not present

## 2016-11-27 DIAGNOSIS — Z87891 Personal history of nicotine dependence: Secondary | ICD-10-CM

## 2016-11-27 DIAGNOSIS — B182 Chronic viral hepatitis C: Secondary | ICD-10-CM | POA: Diagnosis present

## 2016-11-27 DIAGNOSIS — R05 Cough: Secondary | ICD-10-CM | POA: Diagnosis present

## 2016-11-27 DIAGNOSIS — Z7901 Long term (current) use of anticoagulants: Secondary | ICD-10-CM

## 2016-11-27 DIAGNOSIS — G7109 Other specified muscular dystrophies: Secondary | ICD-10-CM

## 2016-11-27 LAB — BASIC METABOLIC PANEL
ANION GAP: 10 (ref 5–15)
BUN: 7 mg/dL (ref 6–20)
CO2: 28 mmol/L (ref 22–32)
Calcium: 10 mg/dL (ref 8.9–10.3)
Chloride: 100 mmol/L — ABNORMAL LOW (ref 101–111)
Creatinine, Ser: <0.3 mg/dL — ABNORMAL LOW (ref 0.61–1.24)
GLUCOSE: 100 mg/dL — AB (ref 65–99)
POTASSIUM: 3.9 mmol/L (ref 3.5–5.1)
Sodium: 138 mmol/L (ref 135–145)

## 2016-11-27 LAB — CBC WITH DIFFERENTIAL/PLATELET
BASOS ABS: 0 10*3/uL (ref 0.0–0.1)
Basophils Relative: 0 %
Eosinophils Absolute: 0.1 10*3/uL (ref 0.0–0.7)
Eosinophils Relative: 3 %
HEMATOCRIT: 46.1 % (ref 39.0–52.0)
HEMOGLOBIN: 14.8 g/dL (ref 13.0–17.0)
LYMPHS PCT: 26 %
Lymphs Abs: 1.3 10*3/uL (ref 0.7–4.0)
MCH: 28 pg (ref 26.0–34.0)
MCHC: 32.1 g/dL (ref 30.0–36.0)
MCV: 87.3 fL (ref 78.0–100.0)
MONO ABS: 0.6 10*3/uL (ref 0.1–1.0)
Monocytes Relative: 12 %
NEUTROS ABS: 3 10*3/uL (ref 1.7–7.7)
NEUTROS PCT: 59 %
Platelets: 259 10*3/uL (ref 150–400)
RBC: 5.28 MIL/uL (ref 4.22–5.81)
RDW: 16.4 % — ABNORMAL HIGH (ref 11.5–15.5)
WBC: 5.1 10*3/uL (ref 4.0–10.5)

## 2016-11-27 LAB — MRSA PCR SCREENING: MRSA BY PCR: NEGATIVE

## 2016-11-27 MED ORDER — CHLORHEXIDINE GLUCONATE 0.12 % MT SOLN
15.0000 mL | Freq: Two times a day (BID) | OROMUCOSAL | Status: DC
  Administered 2016-11-27 – 2016-11-30 (×5): 15 mL via OROMUCOSAL
  Filled 2016-11-27 (×5): qty 15

## 2016-11-27 MED ORDER — MIDAZOLAM HCL 2 MG/2ML IJ SOLN
4.0000 mg | INTRAMUSCULAR | Status: DC | PRN
  Filled 2016-11-27: qty 4

## 2016-11-27 MED ORDER — FLUMAZENIL 0.5 MG/5ML IV SOLN
0.5000 mg | Freq: Once | INTRAVENOUS | Status: DC
  Filled 2016-11-27: qty 5

## 2016-11-27 MED ORDER — SODIUM CHLORIDE 0.9 % IV SOLN
INTRAVENOUS | Status: DC
  Administered 2016-11-27 – 2016-11-30 (×5): via INTRAVENOUS

## 2016-11-27 MED ORDER — MIDAZOLAM HCL 2 MG/2ML IJ SOLN
INTRAMUSCULAR | Status: DC | PRN
  Administered 2016-11-27 (×2): 1 mg via INTRAVENOUS

## 2016-11-27 MED ORDER — FENTANYL CITRATE (PF) 100 MCG/2ML IJ SOLN
INTRAMUSCULAR | Status: DC | PRN
  Administered 2016-11-27 (×2): 50 ug via INTRAVENOUS

## 2016-11-27 MED ORDER — LIDOCAINE VISCOUS 2 % MT SOLN
15.0000 mL | Freq: Once | OROMUCOSAL | Status: DC
  Filled 2016-11-27: qty 15

## 2016-11-27 MED ORDER — FENTANYL CITRATE (PF) 100 MCG/2ML IJ SOLN
200.0000 ug | INTRAMUSCULAR | Status: DC | PRN
  Filled 2016-11-27: qty 4

## 2016-11-27 MED ORDER — LIDOCAINE HCL (PF) 2 % IJ SOLN
10.0000 mL | Freq: Once | INTRAMUSCULAR | Status: DC
  Filled 2016-11-27 (×2): qty 10

## 2016-11-27 MED ORDER — ORAL CARE MOUTH RINSE
15.0000 mL | Freq: Two times a day (BID) | OROMUCOSAL | Status: DC
  Administered 2016-11-28 – 2016-11-30 (×4): 15 mL via OROMUCOSAL

## 2016-11-27 MED ORDER — LIDOCAINE HCL (PF) 4 % IJ SOLN
5.0000 mL | Freq: Once | INTRAMUSCULAR | Status: DC
  Filled 2016-11-27: qty 5

## 2016-11-27 MED ORDER — RIVAROXABAN 20 MG PO TABS
20.0000 mg | ORAL_TABLET | Freq: Every day | ORAL | Status: DC
  Administered 2016-11-28 – 2016-11-29 (×2): 20 mg via ORAL
  Filled 2016-11-27 (×3): qty 1

## 2016-11-27 NOTE — ED Notes (Signed)
Patient transported to X-ray 

## 2016-11-27 NOTE — Consult Note (Signed)
Reason for Consult:aspiration of foreign body Referring Physician: dr Johnney Killian  George Evans is an 61 y.o. male.  HPI: 61  Yr old male with muscular dystrophy aspirated his  ropinrole today morning and unable to cough it out due to weak cough  Has chronic issues with aspiration and secretion clearance due to neuromuscular weakness  no chest pain or sob or any other complaints except above  Past Medical History:  Diagnosis Date  . Abnormal posture   . Chronic viral hepatitis C ([place])   . Constipation   . Difficulty walking   . Distal muscular dystrophy ([place])   . Limb-girdle muscular dystrophy ([place]) 08/19/2016  . Major depressive disorder   . Muscle wasting   . Muscle weakness   . Other diseases of bronchus, not elsewhere classified (CODE)   . Peripheral vascular disease ([place])   . Pressure ulcer of sacral region     Past Surgical History:  Procedure Laterality Date  . APPENDECTOMY  1992  . OTHER SURGICAL HISTORY  2016   Abcess removed from penis  . ROTATOR CUFF REPAIR Right 1989    Family History  Problem Relation Age of Onset  . Diabetes Mother   . Cancer Sister        thyroid  . Cancer Sister        thyroid    Social History:  reports that he quit smoking about 7 years ago. He has a 80.00 pack-year smoking history. He has never used smokeless tobacco. He reports that he does not drink alcohol or use drugs.  Allergies:  Allergies  Allergen Reactions  . Coconut Oil Swelling    Medications: I have reviewed the patient's current medications.  Results for orders placed or performed during the hospital encounter of 11/27/16 (from the past 48 hour(s))  Basic metabolic panel     Status: Abnormal   Collection Time: 11/27/16  1:36 PM  Result Value Ref Range   Sodium 138 135 - 145 mmol/L   Potassium 3.9 3.5 - 5.1 mmol/L   Chloride 100 (L) 101 - 111 mmol/L   CO2 28 22 - 32 mmol/L   Glucose, Bld 100 (H) 65 - 99 mg/dL   BUN 7 6 - 20 mg/dL   Creatinine, Ser <0.30 (L) 0.61  - 1.24 mg/dL   Calcium 10.0 8.9 - 10.3 mg/dL   GFR calc non Af Amer NOT CALCULATED >60 mL/min   GFR calc Af Amer NOT CALCULATED >60 mL/min    Comment: (NOTE) The eGFR has been calculated using the CKD EPI equation. This calculation has not been validated in all clinical situations. eGFR's persistently <60 mL/min signify possible Chronic Kidney Disease.    Anion gap 10 5 - 15  CBC with Differential     Status: Abnormal   Collection Time: 11/27/16  1:36 PM  Result Value Ref Range   WBC 5.1 4.0 - 10.5 K/uL   RBC 5.28 4.22 - 5.81 MIL/uL   Hemoglobin 14.8 13.0 - 17.0 g/dL   HCT 46.1 39.0 - 52.0 %   MCV 87.3 78.0 - 100.0 fL   MCH 28.0 26.0 - 34.0 pg   MCHC 32.1 30.0 - 36.0 g/dL   RDW 16.4 (H) 11.5 - 15.5 %   Platelets 259 150 - 400 K/uL   Neutrophils Relative % 59 %   Neutro Abs 3.0 1.7 - 7.7 K/uL   Lymphocytes Relative 26 %   Lymphs Abs 1.3 0.7 - 4.0 K/uL   Monocytes Relative 12 %  Monocytes Absolute 0.6 0.1 - 1.0 K/uL   Eosinophils Relative 3 %   Eosinophils Absolute 0.1 0.0 - 0.7 K/uL   Basophils Relative 0 %   Basophils Absolute 0.0 0.0 - 0.1 K/uL    Dg Chest 2 View  Result Date: 11/27/2016 CLINICAL DATA:  Aspirated a pill. EXAM: CHEST  2 VIEW COMPARISON:  None. FINDINGS: Low volume chest with streaky density over the bases. There is mild elevation of the left diaphragm. No airspace disease, edema, effusion, or pneumothorax. Normal heart size and mediastinal contours. IMPRESSION: Atelectasis at the left more than right base. No visible pneumonitis or air leak. Electronically Signed   By: Monte Fantasia M.D.   On: 11/27/2016 14:18    Review of Systems  All other systems reviewed and are negative.  Blood pressure (!) 120/91, pulse (!) 110, temperature 98.3 F (36.8 C), resp. rate (!) 22, SpO2 97 %. Physical Exam  Nursing note and vitals reviewed. Constitutional: He is oriented to person, place, and time. He appears cachectic. No distress.  HENT:  Head: Normocephalic and  atraumatic.  Right Ear: External ear normal.  Left Ear: External ear normal.  Mouth/Throat: Oropharynx is clear and moist. No oropharyngeal exudate.  Eyes: Pupils are equal, round, and reactive to light. Right eye exhibits no discharge. Left eye exhibits no discharge. No scleral icterus.  Neck: No JVD present. No tracheal deviation present. No thyromegaly present.  Cardiovascular: Normal rate, regular rhythm and normal heart sounds.  Exam reveals no gallop and no friction rub.   No murmur heard. Respiratory: Effort normal and breath sounds normal. No respiratory distress. He has no wheezes. He has no rales. He exhibits no tenderness.  GI: Soft. Bowel sounds are normal. He exhibits no distension. There is no tenderness. There is no rebound.  Musculoskeletal: Normal range of motion. He exhibits no edema, tenderness or deformity.  significant muscle wasting present  Neurological: He is alert and oriented to person, place, and time. He displays normal reflexes. No cranial nerve deficit. He exhibits normal muscle tone. Coordination normal.  Skin: Skin is warm. He is not diaphoretic.  Psychiatric: His behavior is normal.    Assessment/Plan: Aspiration of tablet in airway Muscular dystrophy Recurrent aspirations  Plan:  Attempt bronch to remove foreign body from airway Discussed with patient and wife he is at high risk of respriatory failure and may need intubation  He has chronic aspiration problems and has refused peg-j tube and tracheostomy in past  He is full code  Risk and benefits of procedure discussed with patient and family in detail. They give consent to proceed.  Will keep intubation equipments are standby in case his respiratory status declines while doing the procedure.  Further admission orders per admitting team.   George Evans 11/27/2016, 4:20 PM

## 2016-11-27 NOTE — Procedures (Addendum)
Bedside Bronchoscopy Procedure Note George Evans [id number] 12-Jul-1955  Bronch done by Dr. Wilber Oliphant.  RN at bedside t/o administering meds and monitoring pt. Assisted MD was myself, Ladora Daniel, RRT and Elbert Ewings, RRT Procedure: Bronchoscopy Indications: Diagnostic evaluation of the airways, eval for foreign body obstruction  Procedure Details: ET Tube Size: ET Tube secured at lip (cm): Bite block in place: No- MD nasal bronched In preparation for procedure, Patient hyper-oxygenated with 100 % FiO2 Airway entered and the following bronchi were examined: Bronchi.  See MD procedure note for full detail. Bronchoscope removed.    Evaluation BP 106/70 (BP Location: Right Arm)   Pulse (!) 110   Temp 97.7 F (36.5 C) (Oral)   Resp 18   Ht 6' (1.829 m)   Wt 155 lb 3.3 oz (70.4 kg)   SpO2 95%   BMI 21.05 kg/m  Breath Sounds:Diminished O2 sats: stable throughout Patient's Current Condition: stable Specimens:  Sent purulent fluid-     addendum:  Bronch sample was labeled/bagged and   initally left w/ ED RN until MD could place lab orders in Epic.  ED RT communicated to RN that bronch sample was in room, and she would return to collect sample and take to lab.  This RT was called away for another emergency.  Upon ED RT returning to take sample to the lab, the pt was moved to another floor and the ED RN reports that she and the tech didn't know where bronch sample was.    Upon myself hearing that sample was lost, I called the floor RN to check pt belongings and the sample was found in the pt belongings still bagged and labeled.  I walked sample to the lab, and the lab states the sample is still good/acceptable and was received to lab for testing.     Complications: No apparent complications Patient did tolerate procedure well.   Jennette Kettle 11/27/2016, 5:51 PM

## 2016-11-27 NOTE — ED Triage Notes (Signed)
Received pt from Nursing Facility on [address] with c/o "swallowed a pill that went down the wrong way." Pt reports after the pill went the wrong way he began to build up mucous that is now stuck in his throat. EMS attempted to suction pt with [place] without success. Pt noted to have Rhonchi throughout all fields.

## 2016-11-27 NOTE — ED Notes (Signed)
Dinner tray ordered; regular diet 

## 2016-11-27 NOTE — ED Provider Notes (Signed)
[facility]-EMERGENCY DEPT Provider Note   CSN: [id number] Arrival date & time: 11/27/16  1239     History   Chief Complaint Chief Complaint  Patient presents with  . Choking    HPI George Evans is a 61 y.o. male.  HPI Patient ports he was trying to take his robe and all pill at 10:30. He reports that he went into his airway and he has been unable to cough it out. He does have MS and has a lot of problems with secretions. He reports now he has this wet cough but can only cough it up so far and then he feels like he can't breathe. He reports he has not had fever chills recently. He does report however he hasn't been eating much and not really eaten since Thursday. He denies he's choked on any food. He reports once before he had something happen like this and he had mucus plugging that had to be aspirated out. Past Medical History:  Diagnosis Date  . Abnormal posture   . Chronic viral hepatitis C (HCC)   . Constipation   . Difficulty walking   . Distal muscular dystrophy (HCC)   . 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 Major depressive disorder   . Muscle wasting   . Muscle weakness   . Other diseases of bronchus, not elsewhere classified (CODE)   . Peripheral vascular disease (HCC)   . Pressure ulcer of sacral region     Patient Active Problem List   Diagnosis Date Noted  . Aspiration into airway 11/27/2016  . Respiratory failure, chronic neuromuscular (HCC) 09/14/2016  . 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 APPENDECTOMY  1992  . OTHER SURGICAL HISTORY  2016   Abcess removed from penis  . ROTATOR CUFF REPAIR Right 1989       Home Medications    Prior to Admission medications   Medication Sig Start Date End Date Taking? Authorizing Provider  cholecalciferol (VITAMIN D) 1000 units tablet Take 1,000 Units by mouth daily.   Yes [provider]  glycopyrrolate (ROBINUL) 1 MG tablet Take 1 mg by mouth every 8 (eight) hours.    Yes [provider]  senna (SENOKOT) 8.6 MG tablet Take 2 tablets by mouth at bedtime. Hold for loose stools   Yes [provider]  guaifenesin (ROBITUSSIN) 100 MG/5ML syrup Take 200 mg by mouth every 6 (six) hours as needed for cough.    [provider]  ondansetron (ZOFRAN) 4 MG tablet Take 4 mg by mouth every 8 (eight) hours as needed for nausea or vomiting.    [provider]  rivaroxaban (XARELTO) 20 MG TABS tablet Take 20 mg by mouth daily.    [provider]    Family History Family History  Problem Relation Age of Onset  . Diabetes Mother   . Cancer Sister        thyroid  . Cancer Sister        thyroid    Social History Social History  Substance Use Topics  . Smoking status: Former Smoker    Packs/day: 2.00    Years: 40.00    Quit date: 06/14/2009  . Smokeless tobacco: Never Used  . Alcohol use No     Allergies   Coconut oil   Review of Systems Review of Systems 10 Systems reviewed and are negative for acute change except  as noted in the HPI.   Physical Exam Updated Vital Signs BP 113/84   Pulse (!) 103   Temp 98.3 F (36.8 C)   Resp (!) 22   SpO2 100%   Physical Exam  Constitutional: He is oriented to person, place, and time.  Patient is alert and nontoxic. He has frequent wet, weak cough.  HENT:  Posterior oropharynx is widely patent.  Eyes: EOM are normal.  Cardiovascular: Normal rate, regular rhythm, normal heart sounds and intact distal pulses.   Pulmonary/Chest:  Coarse rhonchi in upper airways.  Abdominal: Soft. He exhibits no distension. There is no tenderness.  Musculoskeletal:  Patient has MS. He has some extension contracture of the feet. Legs have trace edema.  Neurological: He is alert and oriented to person, place, and time.  Skin: Skin is warm and dry.  Psychiatric: He has a normal mood and  affect.     ED Treatments / Results  Labs (all labs ordered are listed, but only abnormal results are displayed) Labs Reviewed  BASIC METABOLIC PANEL - Abnormal; Notable for the following:       Result Value   Chloride 100 (*)    Glucose, Bld 100 (*)    Creatinine, Ser <0.30 (*)    All other components within normal limits  CBC WITH DIFFERENTIAL/PLATELET - Abnormal; Notable for the following:    RDW 16.4 (*)    All other components within normal limits    EKG  EKG Interpretation None       Radiology Dg Chest 2 View  Result Date: 11/27/2016 CLINICAL DATA:  Aspirated a pill. EXAM: CHEST  2 VIEW COMPARISON:  None. FINDINGS: Low volume chest with streaky density over the bases. There is mild elevation of the left diaphragm. No airspace disease, edema, effusion, or pneumothorax. Normal heart size and mediastinal contours. IMPRESSION: Atelectasis at the left more than right base. No visible pneumonitis or air leak. Electronically Signed   By: Marnee Spring M.D.   On: 11/27/2016 14:18    Procedures Procedures (including critical care time)  Medications Ordered in ED Medications - No data to display   Initial Impression / Assessment and Plan / ED Course  I have reviewed the triage vital signs and the nursing notes.  Pertinent labs & imaging results that were available during my care of the patient were reviewed by me and considered in my medical decision making (see chart for details).    Consult: Intensivist Dr. Darleen Crocker, will examine the patient to determine if bronchoscopy indicated. Consult: Family medicine resident for admission.  Final Clinical Impressions(s) / ED Diagnoses   Final diagnoses:  Choking, initial encounter  MS (multiple sclerosis) (HCC)   Patient reports choking on one of his medications. At this time, I most suspect aspiration of pill foreign body. Patient has a recurrent, wet weak cough. He is protecting his airway and continues to have adequate  respirations. He is not in acute respiratory distress. However with general weakness he has very poor cough reflex at baseline. We have done upper airway suctioning. Patient has only minimal amount of clear secretions in the upper airway. Not resolve the symptoms. New Prescriptions New Prescriptions   No medications on file     Arby Barrette, MD 11/27/16 1536

## 2016-11-27 NOTE — Progress Notes (Signed)
Pt did well with some warm coffee PO, texted Dr. No coughing and was able to swallow without difficulty.

## 2016-11-27 NOTE — Clinical Social Work Note (Signed)
CSW received call from facility [facility], spoke to [place] who questioned when pt would DC and if pt will DC with a trilogy machine. Tammy reports if Trilogy is ordered for pt  [facility] cannot take pt back. According to [facility] ONLY accomodates CPAP/BIPAP machines.  CSW spoke to pt's BSRN, pt still being worked up and it is unclear what pt's DC needs will be at this time. CSW will continue to follow.  Davin Muramoto B. Gean Quint Clinical Social Work Dept Weekend Social Worker [phone] 2:49 PM

## 2016-11-27 NOTE — Procedures (Addendum)
Bronchoscopy Procedure Note George Evans [id number] [birth year]  Procedure: Bronchoscopy Indications: suspected aspiration of tablet  Procedure Details Consent: Risks of procedure as well as the alternatives and risks of each were explained to the (patient/caregiver).  Consent for procedure obtained. Time Out: Verified patient identification, verified procedure, site/side was marked, verified correct patient position, special equipment/implants available, medications/allergies/relevent history reviewed, required imaging and test results available.  Performed  In preparation for procedure, patient was given 100% FiO2, bronchoscope lubricated and lidocaine gel through nostril. Sedation: Benzodiazepines and fentanyl  Patient was given 2mg  iv versed and 100 mcg fentanyl for procedural sedation   Airway entered and the following bronchi were examined: RUL, RML, RLL, LUL, LLL and Bronchi.    Bilateral tracheobronchial tree was normal. No foreign body found.  Bronchoscope removed.    Evaluation Hemodynamic Status: BP stable throughout; O2 sats: stable throughout Patient's Current Condition: stable Specimens:  Sent Complications: No apparent complications Patient did tolerate procedure well.  Recommendation: No foreign body in airway. Okay to discharge from pulmonary standpoint. Washings up and from right lower lobe sent for cultures   [facility] 11/27/2016

## 2016-11-27 NOTE — H&P (Signed)
Family Medicine Teaching [facility] Admission History and Physical Service Pager: [phone]  Patient name: George Evans Medical record number: [id number] Date of birth: 26-Feb-1956 Age: 61 y.o. Gender: male  Primary Care Provider: York Spaniel, MD Consultants: CCM Code Status: Full  Chief Complaint: choked on pill   Assessment and Plan: George Evans is a 61 y.o. male presenting with pill aspiration. PMH is significant for limb-girdle muscular dystrophy, multiple PEs, and previously treated hepatitis C.   #Pill aspiration: Patient with poor gag reflex and weak cough. Concern for foreign body aspiration, though CXR with L>R atelectasis and without pneumonitis or air leak. No hypoxia or increased WOB on exam. Poor air movement throughout. - Admit to med-surg for inpatient only procedure, attending Dr. Deirdre Priest - CCM to perform bronchoscopy - Could consider further imaging with CT chest if unable to visualize pill  - SLP evaluation  #Limb-girdle muscular dystrophy: Follows with Neurologist Dr. Anne Hahn. Progressive weakness resulting in swallowing and breathing difficulties at this time.   #Respiratory failure 2/2 to above: Follows with Pulmonologist Dr. Isaiah Serge. Recommended chest PT, suctioning, smart vest and Trilogy ventilator at night but current SNF unable to accommodate. Anticipate ultimately needing tracheostomy.  - Patient's family looking for SNF that can accommodate nightly BiPap - Has been on guaifenesin and robinul but holding for concern they could be worsening mucus plugging by thickening secretions  #Poor appetite and nutritional status: - Consulted Nutrition  FEN/GI: NPO except sips with meds Prophylaxis: on xarelto  Disposition: Back to SNF pending SLP and nutrition recommendations (anticipate 6/17)  History of Present Illness:  George Evans is a 61 y.o. male presenting after choking on a pill. He took robinul this morning and proceeded to choke and cough all  morning. He denies feeling short of breath but coughing hurts his throat. He has not been eating well because he does not like the food at his SNF [facility]. He says he has had no change in his swallowing since the beginning of the year--he tolerates thin liquids fine but has trouble with purees, as well as tough food items like meat because he does not have teeth. His wife says a pureed diet had been suggested in the past, but that was difficult to swallow. He drinks milk, ensure, and gatorade regularly. He has had trouble with mucus plugging in the past that required bronchoscopy by Pulmonology. Otherwise, he complains of his nasal congestion.   In the ED, he had frequent wet, weak cough and coarse rhonchi. Upper airway suctioning did not result in retrieval of foreign body or resolution of symptoms.   Review Of Systems: Per HPI with the following additions:   Review of Systems  Constitutional: Negative for chills and fever.  HENT: Positive for congestion and sore throat.   Eyes: Negative for blurred vision and pain.  Respiratory: Positive for cough. Negative for hemoptysis and shortness of breath.   Cardiovascular: Negative for chest pain and palpitations.  Gastrointestinal: Negative for abdominal pain, diarrhea and vomiting.  Genitourinary: Negative for dysuria and frequency.  Neurological: Positive for weakness. Negative for speech change and headaches.    Patient Active Problem List   Diagnosis Date Noted  . Aspiration into airway 11/27/2016  . Respiratory failure, chronic neuromuscular (HCC) 09/14/2016  . 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    Past Medical History: Past Medical History:  Diagnosis Date  . Abnormal posture   . Chronic viral hepatitis C (HCC)   .  Constipation   . Difficulty walking   . Distal muscular dystrophy (HCC)   . 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 Major depressive disorder   .  Muscle wasting   . Muscle weakness   . Other diseases of bronchus, not elsewhere classified (CODE)   . Peripheral vascular disease (HCC)   . Pressure ulcer of sacral region     Past Surgical History: Past Surgical History:  Procedure Laterality Date  . APPENDECTOMY  1992  . OTHER SURGICAL HISTORY  2016   Abcess removed from penis  . ROTATOR CUFF REPAIR Right 1989    Social History: Social History  Substance Use Topics  . Smoking status: Former Smoker    Packs/day: 2.00    Years: 40.00    Quit date: 06/14/2009  . Smokeless tobacco: Never Used  . Alcohol use No   Additional social history: Lives at [facility] for past 5 years.  Please also refer to relevant sections of EMR.  Family History: Family History  Problem Relation Age of Onset  . Diabetes Mother   . Cancer Sister        thyroid  . Cancer Sister        thyroid  1 uncle and 2 cousins with myotonic dystrophy  Allergies and Medications: Allergies  Allergen Reactions  . Coconut Oil Swelling   No current facility-administered medications on file prior to encounter.    Current Outpatient Prescriptions on File Prior to Encounter  Medication Sig Dispense Refill  . glycopyrrolate (ROBINUL) 1 MG tablet Take 1 mg by mouth 3 (three) times daily.    Marland Kitchen guaifenesin (ROBITUSSIN) 100 MG/5ML syrup Take 200 mg by mouth every 6 (six) hours as needed for cough.    . ondansetron (ZOFRAN) 4 MG tablet Take 4 mg by mouth every 8 (eight) hours as needed for nausea or vomiting.    . rivaroxaban (XARELTO) 20 MG TABS tablet Take 20 mg by mouth daily.    Marland Kitchen senna (SENOKOT) 8.6 MG tablet Take 2 tablets by mouth at bedtime. Hold for loose stools      Objective: BP 113/84   Pulse (!) 103   Temp 98.3 F (36.8 C)   Resp (!) 22   SpO2 100%  Exam: General: Thin male, in NAD, resting in bed Eyes: PERRLA, EOMI ENTM: Edentulous, erythema of posterior oropharynx Neck: Supple, FROM Cardiovascular: RRR, S1, S2, no mrg Respiratory: Decreased air  movement throughout. Able to speak in complete sentences with ease. Gastrointestinal: +BS, soft, NT MSK: Decreased tone of LEs. Bilateral foot drop.  Derm: No rash or wounds on exposed skin noted.  Neuro: AOx3, 2/5 strength with flexion at knee. 1/5 strength with extension at knee. 3/5 grip strength. 2/5 strength with extension at elbow.  Psych: Somewhat flat affect, normal mood.   Labs and Imaging: CBC BMET   Recent Labs Lab 11/27/16 1336  WBC 5.1  HGB 14.8  HCT 46.1  PLT 259    Recent Labs Lab 11/27/16 1336  NA 138  K 3.9  CL 100*  CO2 28  BUN 7  CREATININE <0.30*  GLUCOSE 100*  CALCIUM 10.0     Dg Chest 2 View  Result Date: 11/27/2016 CLINICAL DATA:  Aspirated a pill. EXAM: CHEST  2 VIEW COMPARISON:  None. FINDINGS: Low volume chest with streaky density over the bases. There is mild elevation of the left diaphragm. No airspace disease, edema, effusion, or pneumothorax. Normal heart size and mediastinal contours. IMPRESSION: Atelectasis at the left more than  right base. No visible pneumonitis or air leak. Electronically Signed   By: Marnee Spring M.D.   On: 11/27/2016 14:18   Casey Burkitt, MD 11/27/2016, 3:25 PM PGY-2, [place] [facility] FPTS Intern pager: [phone], text pages welcome

## 2016-11-27 NOTE — ED Notes (Signed)
Received call from social worker re: received call from facility and they are informing that pt cannot return to facility if he needs a trilology machine.

## 2016-11-27 NOTE — ED Notes (Addendum)
Nurse Practitioner from [facility] called to check on pt and reports if there is anything they can do to help to let them know.

## 2016-11-27 NOTE — Progress Notes (Signed)
Pt admitted to 6N17 from [facility] facility, family at bedside.

## 2016-11-27 NOTE — Progress Notes (Signed)
Called to NTS pt.  Sterile technique used to suction left nare with lubricant.  Pt did not tolerate and told me " to stop and get it out". Used yankeur and able to get large amounts of thick, clear secretions and pt said it helped.

## 2016-11-28 DIAGNOSIS — T17308A Unspecified foreign body in larynx causing other injury, initial encounter: Secondary | ICD-10-CM

## 2016-11-28 LAB — CBC
HEMATOCRIT: 41.3 % (ref 39.0–52.0)
HEMOGLOBIN: 12.9 g/dL — AB (ref 13.0–17.0)
MCH: 27.3 pg (ref 26.0–34.0)
MCHC: 31.2 g/dL (ref 30.0–36.0)
MCV: 87.3 fL (ref 78.0–100.0)
Platelets: 210 10*3/uL (ref 150–400)
RBC: 4.73 MIL/uL (ref 4.22–5.81)
RDW: 16.4 % — ABNORMAL HIGH (ref 11.5–15.5)
WBC: 5.9 10*3/uL (ref 4.0–10.5)

## 2016-11-28 LAB — BASIC METABOLIC PANEL
ANION GAP: 8 (ref 5–15)
BUN: 5 mg/dL — ABNORMAL LOW (ref 6–20)
CHLORIDE: 104 mmol/L (ref 101–111)
CO2: 26 mmol/L (ref 22–32)
Calcium: 8.9 mg/dL (ref 8.9–10.3)
Creatinine, Ser: <0.3 mg/dL — ABNORMAL LOW (ref 0.61–1.24)
Glucose, Bld: 84 mg/dL (ref 65–99)
POTASSIUM: 3.8 mmol/L (ref 3.5–5.1)
Sodium: 138 mmol/L (ref 135–145)

## 2016-11-28 LAB — HIV ANTIBODY (ROUTINE TESTING W REFLEX): HIV Screen 4th Generation wRfx: NONREACTIVE

## 2016-11-28 MED ORDER — FLUTICASONE PROPIONATE 50 MCG/ACT NA SUSP
2.0000 | Freq: Every day | NASAL | Status: DC
  Administered 2016-11-28 – 2016-11-30 (×3): 2 via NASAL
  Filled 2016-11-28: qty 16

## 2016-11-28 NOTE — Discharge Instructions (Signed)
Information on my medicine - XARELTO® (rivaroxaban) ° °This medication education was reviewed with me or my healthcare representative as part of my discharge preparation.  The pharmacist that spoke with me during my hospital stay was:  Taiya Nutting Dien, RPH ° °WHY WAS XARELTO® PRESCRIBED FOR YOU? °Xarelto® was prescribed to treat blood clots that may have been found in the veins of your legs (deep vein thrombosis) or in your lungs (pulmonary embolism) and to reduce the risk of them occurring again. ° °What do you need to know about Xarelto®? °The dose is one 20 mg tablet taken ONCE A DAY with your evening meal. ° °DO NOT stop taking Xarelto® without talking to the health care provider who prescribed the medication.  Refill your prescription for 20 mg tablets before you run out. ° °After discharge, you should have regular check-up appointments with your healthcare provider that is prescribing your Xarelto®.  In the future your dose may need to be changed if your kidney function changes by a significant amount. ° °What do you do if you miss a dose? °If you are taking Xarelto® TWICE DAILY and you miss a dose, take it as soon as you remember. You may take two 15 mg tablets (total 30 mg) at the same time then resume your regularly scheduled 15 mg twice daily the next day. ° °If you are taking Xarelto® ONCE DAILY and you miss a dose, take it as soon as you remember on the same day then continue your regularly scheduled once daily regimen the next day. Do not take two doses of Xarelto® at the same time.  ° °Important Safety Information °Xarelto® is a blood thinner medicine that can cause bleeding. You should call your healthcare provider right away if you experience any of the following: °? Bleeding from an injury or your nose that does not stop. °? Unusual colored urine (red or dark brown) or unusual colored stools (red or black). °? Unusual bruising for unknown reasons. °? A serious fall or if you hit your head (even if  there is no bleeding). ° °Some medicines may interact with Xarelto® and might increase your risk of bleeding while on Xarelto®. To help avoid this, consult your healthcare provider or pharmacist prior to using any new prescription or non-prescription medications, including herbals, vitamins, non-steroidal anti-inflammatory drugs (NSAIDs) and supplements. ° °This website has more information on Xarelto®: www.xarelto.com. °

## 2016-11-28 NOTE — Progress Notes (Signed)
Family Medicine Teaching Service Daily Progress Note Intern Pager: [phone]  Patient name: George Evans Medical record number: [id number] Date of birth: [birth year] Age: 61 y.o. Gender: male  Primary Care Provider: York Spaniel, MD Consultants: CCM Code Status: full  Pt Overview and Major Events to Date:  6/16 - admit  Assessment and Plan: Maika Kaczmarek is a 61 y.o. male presenting with pill aspiration. PMH is significant for limb-girdle muscular dystrophy, multiple PEs, and previously treated hepatitis C.   #Pill aspiration: Patient with poor gag reflex and weak cough. Concern for foreign body aspiration, though CXR with L>R atelectasis and without pneumonitis or air leak. No hypoxia or increased WOB on exam. Poor air movement throughout. S/p bronch by CCM with no foreign body identified, cultures sent - SLP evaluation pending  #Limb-girdle muscular dystrophy: Follows with Neurologist Dr. Anne Hahn. Progressive weakness resulting in swallowing and breathing difficulties at this time.   #Respiratory failure 2/2 to above: Follows with Pulmonologist Dr. Isaiah Serge. Recommended chest PT, suctioning, smart vest and Trilogy ventilator at night. Anticipate ultimately needing tracheostomy. Patient's pulmonologist strongly recommending that patient discharged to facility where he can receive nightly BiPAP for airway protection. Current SNF unable to accommodate nighttime Trilogy ventilator.  - Patient's family looking for SNF that can accommodate nightly BiPap - consult SW to assist with placement - Has been on guaifenesin and robinul but holding for concern they could be worsening mucus plugging by thickening secretions  #Poor appetite and nutritional status: - Consulted Nutrition  FEN/GI: NPO except sips with meds, pending SLP eval Prophylaxis: on xarelto  Disposition: pending SLP, nutrition, and SW recs  Subjective:  Feeling better today than on admission. Breathing seems to be at  baseline.  No choking with sips of water with meds. Patient currently at SNF that is unable to accommodate nightly Trilogy ventilator strongly recommended by pulmonologist for airway protection. He is hoping to be able to transfer to SNF that will be able to provide nightly BiPAP as recommended.   Objective: Temp:  [97.4 F (36.3 C)-98.3 F (36.8 C)] 97.4 F (36.3 C) (06/17 0554) Pulse Rate:  [95-121] 98 (06/17 0554) Resp:  [13-32] 18 (06/17 0554) BP: (102-147)/(67-96) 102/67 (06/17 0554) SpO2:  [95 %-100 %] 99 % (06/17 0554) Weight:  [155 lb 3.3 oz (70.4 kg)] 155 lb 3.3 oz (70.4 kg) (06/16 1748) Physical Exam: General: Thin male, in NAD, resting in bed Eyes: PERRLA, EOMI Cardiovascular: RRR, S1, S2, no mrg Respiratory: Decreased air movement throughout. Able to speak in complete sentences with ease. Gastrointestinal: +BS, soft, NT MSK: Decreased tone of LEs.  Derm: No rash or wounds on exposed skin noted.  Neuro: AOx3 Psych: Somewhat flat affect, normal mood.   Laboratory:  Recent Labs Lab 11/27/16 1336 11/28/16 0310  WBC 5.1 5.9  HGB 14.8 12.9*  HCT 46.1 41.3  PLT 259 210    Recent Labs Lab 11/27/16 1336 11/28/16 0310  NA 138 138  K 3.9 3.8  CL 100* 104  CO2 28 26  BUN 7 5*  CREATININE <0.30* <0.30*  CALCIUM 10.0 8.9  GLUCOSE 100* 84     Imaging/Diagnostic Tests: Dg Chest 2 View  Result Date: 11/27/2016 CLINICAL DATA:  Aspirated a pill. EXAM: CHEST  2 VIEW COMPARISON:  None. FINDINGS: Low volume chest with streaky density over the bases. There is mild elevation of the left diaphragm. No airspace disease, edema, effusion, or pneumothorax. Normal heart size and mediastinal contours. IMPRESSION: Atelectasis at the left more than right base.  No visible pneumonitis or air leak. Electronically Signed   By: Marnee Spring M.D.   On: 11/27/2016 14:18     Erasmo Downer, MD 11/28/2016, 9:21 AM PGY-3, [place] [facility] FPTS Intern pager: [phone],  text pages welcome

## 2016-11-28 NOTE — Progress Notes (Signed)
Pt had an episode today of feeling like he was choking and couldn't breathe. Suctioned with small catheter and then used the Yankeur suction to remove mucous.  Started Flonase.  Oral care done with suction swab.  Pt likes warm liquids like coffee and chicken broth, I used chicken noodle soup without the noodles and he felt it helped clear things up.  Family usually at bedside.

## 2016-11-28 NOTE — Evaluation (Signed)
Clinical/Bedside Swallow Evaluation Patient Details  Name: George Evans MRN: [id number] Date of Birth: Sep 25, 1955  Today's Date: 11/28/2016 Time: SLP Start Time (ACUTE ONLY): 1013 SLP Stop Time (ACUTE ONLY): 1046 SLP Time Calculation (min) (ACUTE ONLY): 33 min  Past Medical History:  Past Medical History:  Diagnosis Date  . Abnormal posture   . Chronic viral hepatitis C (HCC)   . Constipation   . Difficulty walking   . Distal muscular dystrophy (HCC)   . 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 Major depressive disorder   . Muscle wasting   . Muscle weakness   . Other diseases of bronchus, not elsewhere classified (CODE)   . Peripheral vascular disease (HCC)   . Pressure ulcer of sacral region    Past Surgical History:  Past Surgical History:  Procedure Laterality Date  . APPENDECTOMY  1992  . OTHER SURGICAL HISTORY  2016   Abcess removed from penis  . ROTATOR CUFF REPAIR Right 1989   HPI:  Ptis a 61 y.o.malepresenting with concern for pill aspiration, although no foreign object was found during bronchoscopy. PMH is significant for limb-girdle muscular dystrophy, multiple PEs, and previously treated hepatitis C. He had an MBS completed in March 2018 with report not available as it was done at an outside facility. Per prior MD notes and pt report, recommendations were for small bites/sips to reduce the risk of aspiration which was felt to be high, although no aspiration was observed during the study. Pt also said that he has had his esophagus stretched in the past.   Assessment / Plan / Recommendation Clinical Impression  Pt has a h/o dysphagia, which he manages by taking small bites/sips, eating only softer foods, sitting fully upright for intake, and performing swallowing exercises to maintain oropharyngeal strength. During skilled observation he shows signs of dysphagia and potentially penetration/aspiration, including multiple swallows and intermittent throat  clearing. Pt says that these symptoms have been present "all my life" and denies any acute changes. Because of this, he politely declined repeating MBS while in house - he acknowledges that he is at risk for aspiration, and he would prefer to continue his current dysphagia management program. Will initiate Dys 3 diet with chopped meats and meds crushed in puree. Thin liquids are okay. Would use aforementioned precautions during intake; SLP will f/u for tolerance. SLP Visit Diagnosis: Dysphagia, oropharyngeal phase (R13.12)    Aspiration Risk  Moderate aspiration risk;Severe aspiration risk    Diet Recommendation Dysphagia 3 (Mech soft);Thin liquid   Liquid Administration via: Straw Medication Administration: Crushed with puree Supervision: Full supervision/cueing for compensatory strategies;Staff to assist with self feeding (set up assist) Compensations: Slow rate;Small sips/bites;Multiple dry swallows after each bite/sip;Follow solids with liquid;Effortful swallow Postural Changes: Seated upright at 90 degrees;Remain upright for at least 30 minutes after po intake    Other  Recommendations Oral Care Recommendations: Oral care QID   Follow up Recommendations Skilled Nursing facility      Frequency and Duration min 2x/week  1 week       Prognosis Prognosis for Safe Diet Advancement: Fair Barriers to Reach Goals: Time post onset      Swallow Study   General HPI: Ptis a 61 y.o.malepresenting with concern for pill aspiration, although no foreign object was found during bronchoscopy. PMH is significant for limb-girdle muscular dystrophy, multiple PEs, and previously treated hepatitis C. He had an MBS completed in March 2018 with report not available as it was done at an outside facility. Per prior  MD notes and pt report, recommendations were for small bites/sips to reduce the risk of aspiration which was felt to be high, although no aspiration was observed during the study. Pt also said  that he has had his esophagus stretched in the past. Type of Study: Bedside Swallow Evaluation Previous Swallow Assessment: see HPI Diet Prior to this Study: NPO Temperature Spikes Noted: No Respiratory Status: Room air History of Recent Intubation: No Behavior/Cognition: Alert;Cooperative;Pleasant mood Oral Cavity Assessment: Within Functional Limits Oral Care Completed by SLP: No Oral Cavity - Dentition: Edentulous Vision: Functional for self-feeding Self-Feeding Abilities: Needs set up;Needs assist Patient Positioning: Upright in bed Baseline Vocal Quality: Normal Volitional Cough: Weak    Oral/Motor/Sensory Function     Ice Chips Ice chips: Not tested   Thin Liquid Thin Liquid: Impaired Presentation: Self Fed;Straw Pharyngeal  Phase Impairments: Multiple swallows;Throat Clearing - Immediate    Nectar Thick Nectar Thick Liquid: Not tested   Honey Thick Honey Thick Liquid: Not tested   Puree Puree: Not tested   Solid   GO   Solid: Impaired Oral Phase Impairments: Impaired mastication Pharyngeal Phase Impairments: Multiple swallows;Throat Clearing - Immediate        Maxcine Ham 11/28/2016,11:06 AM  Maxcine Ham, M.A. CCC-SLP [phone]

## 2016-11-28 NOTE — Progress Notes (Signed)
Patient may have liquids per request; verified per Dr Sampson Goon.

## 2016-11-29 DIAGNOSIS — G35 Multiple sclerosis: Secondary | ICD-10-CM

## 2016-11-29 DIAGNOSIS — T17908A Unspecified foreign body in respiratory tract, part unspecified causing other injury, initial encounter: Secondary | ICD-10-CM

## 2016-11-29 MED ORDER — GUAIFENESIN 100 MG/5ML PO SOLN
15.0000 mL | Freq: Four times a day (QID) | ORAL | Status: DC
  Administered 2016-11-29 – 2016-11-30 (×5): 300 mg via ORAL
  Filled 2016-11-29 (×5): qty 15

## 2016-11-29 MED ORDER — SENNA 8.6 MG PO TABS
1.0000 | ORAL_TABLET | Freq: Every day | ORAL | Status: DC
  Administered 2016-11-29: 8.6 mg via ORAL
  Filled 2016-11-29: qty 1

## 2016-11-29 MED ORDER — GUAIFENESIN ER 600 MG PO TB12: 600.0000 mg | ORAL_TABLET | Freq: Two times a day (BID) | ORAL | Status: DC

## 2016-11-29 MED ORDER — ENSURE ENLIVE PO LIQD
237.0000 mL | Freq: Three times a day (TID) | ORAL | Status: DC
  Administered 2016-11-29 – 2016-11-30 (×3): 237 mL via ORAL

## 2016-11-29 NOTE — Progress Notes (Signed)
Report given to Irfa, RN at [facility]

## 2016-11-29 NOTE — Progress Notes (Signed)
Family Medicine Teaching Service Daily Progress Note Intern Pager: [phone]  Patient name: George Evans Medical record number: [id number] Date of birth: [birth year] Age: 61 y.o. Gender: male  Primary Care Provider: York Spaniel, MD Consultants: CCM Code Status: Full   Pt Overview and Major Events to Date:  6/16-admit  Assessment and Plan: George Evans a 61 y.o.malepresenting with pill aspiration. PMH is significant for limb-girdle muscular dystrophy, multiple PEs, and previously treated hepatitis C.   #Pill aspiration, s/p Bronchoscopy S/p bronch by CCM with no foreign body identified,  Bronch aspirate drawn and sent for culture --Follow up on cultures  #Limb-girdle muscular dystrophy Follows with Neurologist Dr. Anne Hahn. Progressive weakness resulting in swallowing and breathing difficulties at this time. Patient assessed by speech and is currently on dysphagia 3 diet. Patient will be started on Bipap. --Will monitor respiratory status while on Bipap tonight   #Respiratory failure 2/2 to above Follows with Pulmonologist Dr. Isaiah Serge. Recommended chest PT, suctioning, smart vest and Trilogy ventilator at night. Anticipate ultimately needing tracheostomy. Patient's pulmonologist strongly recommending that patient discharged to facility where he can receive nightly BiPAP for airway protection. SNF unable to accommodate nighttime Trilogy ventilator however able to do Bipap and would like to have patient started on Bipap in the hospital prior to returning to SNF. Patient also have been having issues with secretions. He was on guaifenesin and robunil to decrease secretions. However, patient has a history of mucus plugging possibly for overdrying secretions.  --Will hold Robunil --Restart guaifenesin 300 mg q6 --Continue fluticasone 50 mcg 2 spray   --Has been on guaifenesin and robinul but holding for concern they could be worsening mucus plugging by thickening secretions  #Poor  appetite and nutritional status: 10 lbs weight loss 3 months ago,  Some muscle wasting. Evaluation consistent with inadequate oral intake in the setting of dysphagia. ---Ensure Enlive po TID, each supplement provides 350 kcal and 20 grams of protein  FEN/GI: Dysphagia 3 diet  PPx: On xarelto  Disposition: Discharge tomorrow after monitoring overnight on Bipap  Subjective:  Patient is feeling better this morning. Would like to find a long term facilities that will provide bipap as recommended by pulm. Family at bedside and supportive.  Objective: Temp:  [97.7 F (36.5 C)-98.1 F (36.7 C)] 97.7 F (36.5 C) (06/18 1502) Pulse Rate:  [86-93] 93 (06/18 1502) Resp:  [18-19] 18 (06/18 1502) BP: (98-115)/(68-75) 113/68 (06/18 1502) SpO2:  [98 %-99 %] 98 % (06/18 1502)  Physical Exam:  General: Thin male, in NAD, resting in bed Eyes: PERRLA, EOMI Cardiovascular: RRR, S1, S2, no mrg Respiratory: Decreased air movement throughout. Able to speak in complete sentences with ease. Gastrointestinal: +BS, soft, NT MSK: Decreased tone of LEs.  Derm: No rash or wounds on exposed skin noted.  Neuro: AOx3 Psych: Somewhat flat affect, normal mood.   Laboratory:  Recent Labs Lab 11/27/16 1336 11/28/16 0310  WBC 5.1 5.9  HGB 14.8 12.9*  HCT 46.1 41.3  PLT 259 210    Recent Labs Lab 11/27/16 1336 11/28/16 0310  NA 138 138  K 3.9 3.8  CL 100* 104  CO2 28 26  BUN 7 5*  CREATININE <0.30* <0.30*  CALCIUM 10.0 8.9  GLUCOSE 100* 84    Imaging/Diagnostic Tests:  Dg Chest 2 View  Result Date: 11/27/2016 CLINICAL DATA:  Aspirated a pill. EXAM: CHEST  2 VIEW COMPARISON:  None. FINDINGS: Low volume chest with streaky density over the bases. There is mild elevation of the left  diaphragm. No airspace disease, edema, effusion, or pneumothorax. Normal heart size and mediastinal contours. IMPRESSION: Atelectasis at the left more than right base. No visible pneumonitis or air leak.  Electronically Signed   By: Marnee Spring M.D.   On: 11/27/2016 14:18    Lovena Neighbours, MD 11/29/2016, 4:36 PM PGY-1, [place] [facility] FPTS Intern pager: [phone], text pages welcome

## 2016-11-29 NOTE — Progress Notes (Signed)
  Speech Language Pathology Treatment: Dysphagia  Patient Details Name: George Evans MRN: [id number] DOB: [birth year] Today's Date: 11/29/2016 Time: 1000-1020 SLP Time Calculation (min) (ACUTE ONLY): 20 min  Assessment / Plan / Recommendation Clinical Impression  SLP provided skilled observation during PO intake. He used compensatory strategies with thin liquids via straw with occasional throat clearing, although maintaining a clear vocal quality the whole time. He attempted a bite of jell-o but quickly determined that it was not something that he could swallow well. He spit out what was left in his mouth and used a liquid wash with Mod I for what potentially remained in his pharynx. Pt shared that he discussed a possible MBS with his wife, and that they both politely decline it at this time, given that he does not note any changes in his dysphagia since his most recent one in March. SLP provided education about the likelihood to have further decline in his swallowing in the future, even transient changes due to acute illness, that may warrant consideration of repeat testing. For now he acknowledges his risk of aspiration and utilizes his strategies and exercises with Mod I. He asked for additional exercises. SLP provided education that without being able to see prior MBS report I cannot provide specific exercises targeting his particular areas of weakness, but reviewed some swallowing exercises with him (CTAR, effortful swallows, mendelsohn) that in general can help strengthen and/or maintain his swallowing. Will continue to f/u for education and training while in house.    HPI HPI: Ptis a 61 y.o.malepresenting with concern for pill aspiration, although no foreign object was found during bronchoscopy. PMH is significant for limb-girdle muscular dystrophy, multiple PEs, and previously treated hepatitis C. He had an MBS completed in March 2018 with report not available as it was done at an outside  facility. Per prior MD notes and pt report, recommendations were for small bites/sips to reduce the risk of aspiration which was felt to be high, although no aspiration was observed during the study. Pt also said that he has had his esophagus stretched in the past.      SLP Plan  Continue with current plan of care       Recommendations  Diet recommendations: Dysphagia 3 (mechanical soft);Thin liquid Liquids provided via: Straw Medication Administration: Crushed with puree Supervision: Staff to assist with self feeding;Full supervision/cueing for compensatory strategies Compensations: Slow rate;Small sips/bites;Multiple dry swallows after each bite/sip;Follow solids with liquid;Effortful swallow Postural Changes and/or Swallow Maneuvers: Seated upright 90 degrees;Upright 30-60 min after meal                Oral Care Recommendations: Oral care QID Follow up Recommendations: Skilled Nursing facility SLP Visit Diagnosis: Dysphagia, oropharyngeal phase (R13.12) Plan: Continue with current plan of care       GO                Maxcine Ham 11/29/2016, 10:30 AM  Maxcine Ham, M.A. CCC-SLP [phone]

## 2016-11-29 NOTE — Discharge Summary (Signed)
Family Medicine Teaching [facility] Discharge Summary  Patient name: George Evans Medical record number: [id number] Date of birth: 01-Dec-1955 Age: 61 y.o. Gender: male Date of Admission: 11/27/2016  Date of Discharge: 11/30/16  Admitting Physician: Carney Living, MD  Primary Care Provider: York Spaniel, MD Consultants: CCM, SLP  Indication for Hospitalization: aspiration of pill  Discharge Diagnoses/Problem List:  Patient Active Problem List   Diagnosis Date Noted  . 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 MS (multiple sclerosis) (HCC)   . Aspiration into airway 11/27/2016  . Choking   . Muscular weakness   . Respiratory failure, chronic neuromuscular (HCC) 09/14/2016  . 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 Muscular dystrophies (HCC) 08/19/2016     Disposition: SNF  Discharge Condition: Stable  Discharge Exam:   General: Thin male, in NAD, resting in bed Eyes: PERRLA, EOMI Cardiovascular: RRR, S1, S2, no mrg Respiratory: Decreased air movement throughout. Able to speak in complete sentences with ease. Gastrointestinal: +BS, soft, NT MSK: Decreased tone of LEs.  Derm: No rash or wounds on exposed skin noted.  Neuro: AOx3 Psych: Somewhat flat affect, normal mood.   Brief Hospital Course:  George Evans is a 61 year old male with PMH of limb-girdle muscular dystrophy, multiple PEs and hepatitis C (treated) who presented to [facility] ED from SNF after aspirating a pill. CXR demonstrated L>R atelectasis without signs of pneumonitis or air leak. He was admitted to [facility] for management. CCM was consulted and performed a bronchoscopy on 6/16 that demonstrated no foreign body in airway. Patient was medically stable for discharge but current SNF could not accommodate his Trilogy ventilator recommended by patient's pulmonologist, Dr. Isaiah Serge. After discussing with Dr. Isaiah Serge, patient discharged to SNF with  recommendations to use BiPap at night. Patient tolerated Bipap well during hospitalization and is stable for discharge.  Issues for Follow Up:  1. Patient recommended to use Trilogy Vest for respiratory support by pulmonologist Dr. Isaiah Serge, however facility cannot support this machine. Patient to use Bipap at night, if he fails this, will have to follow up with pulmonology 2. Speech recommends dysphagia 3 diet, medications given crushed with puree 3. Will reduce robinul from 1 mg tid to 0.5 tid. Continue with Flonase and Guaifenesin.   Significant Procedures: Bronchoscopy 11/27/16  Significant Labs and Imaging:   Recent Labs Lab 11/27/16 1336 11/28/16 0310  WBC 5.1 5.9  HGB 14.8 12.9*  HCT 46.1 41.3  PLT 259 210    Recent Labs Lab 11/27/16 1336 11/28/16 0310  NA 138 138  K 3.9 3.8  CL 100* 104  CO2 28 26  GLUCOSE 100* 84  BUN 7 5*  CREATININE <0.30* <0.30*  CALCIUM 10.0 8.9   Dg Chest 2 View  Result Date: 11/27/2016 CLINICAL DATA:  Aspirated a pill. EXAM: CHEST  2 VIEW COMPARISON:  None. FINDINGS: Low volume chest with streaky density over the bases. There is mild elevation of the left diaphragm. No airspace disease, edema, effusion, or pneumothorax. Normal heart size and mediastinal contours. IMPRESSION: Atelectasis at the left more than right base. No visible pneumonitis or air leak. Electronically Signed   By: Marnee Spring M.D.   On: 11/27/2016 14:18   Results/Tests Pending at Time of Discharge: none  Discharge Medications:  Allergies as of 11/30/2016      Reactions   Coconut Oil Swelling      Medication List    TAKE these medications   cholecalciferol 1000  units tablet Commonly known as:  VITAMIN D Take 1,000 Units by mouth daily.   fluticasone 50 MCG/ACT nasal spray Commonly known as:  FLONASE Place 2 sprays into both nostrils daily. Start taking on:  12/01/2016   glycopyrrolate 1 MG tablet Commonly known as:  ROBINUL Take 0.5 tablets (0.5 mg total) by  mouth every 8 (eight) hours. What changed:  how much to take   guaifenesin 100 MG/5ML syrup Commonly known as:  ROBITUSSIN Take 200 mg by mouth every 6 (six) hours as needed for cough.   ondansetron 4 MG tablet Commonly known as:  ZOFRAN Take 4 mg by mouth every 8 (eight) hours as needed for nausea or vomiting.   rivaroxaban 20 MG Tabs tablet Commonly known as:  XARELTO Take 20 mg by mouth daily.   senna 8.6 MG tablet Commonly known as:  SENOKOT Take 2 tablets by mouth at bedtime. Hold for loose stools            Durable Medical Equipment        Start     Ordered   11/30/16 1121  For home use only DME Bipap  Once    Comments:  Titrate between 10/5 to 15/5. Adult BiPAP. Nasal mask size medium.   11/30/16 1121      Discharge Instructions: Please refer to Patient Instructions section of EMR for full details.  Patient was counseled important signs and symptoms that should prompt return to medical care, changes in medications, dietary instructions, activity restrictions, and follow up appointments.   Follow-Up Appointments: Follow-up Information    York Spaniel, MD. Schedule an appointment as soon as possible for a visit.   Specialty:  Neurology Contact information: [address] [place] [address] [phone]        Chilton Greathouse, MD. Schedule an appointment as soon as possible for a visit.   Specialty:  Pulmonary Disease Why:  follow up for chronic respiratory failure Contact information: [address] [place] [address] [phone]           Lovena Neighbours, MD 11/30/2016, 1:44 PM PGY-1, [facility]

## 2016-11-29 NOTE — Clinical Social Work Note (Signed)
CSW consutled for "Needs SNF with respiratory care available for special Bipap needs overnight; Skilled Nursing facility placement." CSW received call from [place] at [facility] stating if pt only need bi-pap overnight the facility can accommodate. However, facility cannot accommodate Trilogy.   CSW and RN Joaquim Lai spoke with Dr. Vanetta Shawl to get clarity as to if pt needs Trilogy vs bipap. Per Dr. Vanetta Shawl, Dr. Mamon-pulmonologist states pt can try bi-pap overnight to see if it's sufficient and if it is not pt will need to be discharged to a SNF that can accommodate trilogy. Dr. Vanetta Shawl put in order for pt to be transferred to stepdown for bi-pap. RN aware.   CSW met with pt at bedside with wife-Janet and sister present. CSW introduced self and explained role of social work. Pt from [facility] and has lived in 4 SNFs over the past 5 years, including [place], [place], and [place]. CSW updated pt on above information. CSW explained discharge back to [facility] will be dependent upon how pt responds to bi-pap machine overnight. Pt agreeable to SNF, but does not wish to return to [facility] upon discharge. Pt prefers SNF in [place], [place], [place] or [place] ([employer].   Pt states he feels better than he has felt in months since he "coughed up that stuff" yesterday. Pt states he is breathing much better. CSW will continue to follow for family support and discharge needs.   Oretha Ellis, [place], [place] Work [phone]

## 2016-11-29 NOTE — Progress Notes (Signed)
SW questioning about patient need on discharge (re: trilogy vent). RT called and verify about this need, Md paged and addressed about patient need on discharge. Md ordered to transfer patient to stepdown. Per SW, the facility will not accept patient if we have not put patient on Bipap, need patient to be on bipap at night andsee  if patient will tolerate it prior to discharge. Charge nurse made aware of the transfer order. Patient is alert and oriented, not in any distress, on RA, O2sat 98%

## 2016-11-29 NOTE — Progress Notes (Signed)
FMTS ATTENDING NOTE Quint Chestnut,MD I  have seen and examined this patient, reviewed their chart. I have discussed this patient with the resident.    Patient denies new symptoms. He is concern about his Ropinol and Robitussin which were held since admission. He will like to know when he will get back on it because of his secretion.  No current facility-administered medications on file prior to encounter.    Current Outpatient Prescriptions on File Prior to Encounter  Medication Sig Dispense Refill  . glycopyrrolate (ROBINUL) 1 MG tablet Take 1 mg by mouth every 8 (eight) hours.     . ondansetron (ZOFRAN) 4 MG tablet Take 4 mg by mouth every 8 (eight) hours as needed for nausea or vomiting.    . rivaroxaban (XARELTO) 20 MG TABS tablet Take 20 mg by mouth daily.    Marland Kitchen senna (SENOKOT) 8.6 MG tablet Take 2 tablets by mouth at bedtime. Hold for loose stools    . guaifenesin (ROBITUSSIN) 100 MG/5ML syrup Take 200 mg by mouth every 6 (six) hours as needed for cough.     Past Medical History:  Diagnosis Date  . Abnormal posture   . Chronic viral hepatitis C (HCC)   . Constipation   . Difficulty walking   . Distal muscular dystrophy (HCC)   . 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 Major depressive disorder   . Muscle wasting   . Muscle weakness   . Other diseases of bronchus, not elsewhere classified (CODE)   . Peripheral vascular disease (HCC)   . Pressure ulcer of sacral region    Vitals:   11/28/16 1417 11/28/16 2043 11/29/16 0416 11/29/16 1502  BP: 107/70 98/71 115/75 113/68  Pulse: 93 93 86 93  Resp: 18 19 19 18   Temp: 97.9 F (36.6 C) 98.1 F (36.7 C) 98 F (36.7 C) 97.7 F (36.5 C)  TempSrc: Oral Oral Oral Oral  SpO2: 99% 99% 99% 98%  Weight:      Height:       No results found.   EXAM: Gen: Calm in bd, not in distress. HEENT: EOMI, PERRLA. Neuro: Awake and alert, oriented x 3. No sensory loss Heart: S1 S2 normal. RRR. Lungs: Air entry equal and CTA  B/L Abd: SOft, NT/ND, BS+ and normal. Ext: No edema.  A/P:   ??Aspiration PNA s/p choking on Ropinol.   S/P Bedside bronch with no confirmation of FB.   Xray reviewed negative for pneumonitis.    Culture pending.   He is doing well otherwise.   SLP evaluation completed suggestive of severe risk for dysphagia.   Discussed crushing pills with him.    For his excessive secretion, we will restart him Robitussin and then low dose of Ropinol.     Muscular dystrophy and chronic Respiratory failure:Stable without acute flare. Good pulmonology and neurology follow-up established before admission. Awaiting SNF where Trilogy Vent can be provided and smart vest.  Other chronic conditions see resident's progress note today. I will cosign when done.

## 2016-11-29 NOTE — Progress Notes (Signed)
Initial Nutrition Assessment  DOCUMENTATION CODES:   Not applicable  INTERVENTION:   -Ensure Enlive po TID, each supplement provides 350 kcal and 20 grams of protein  NUTRITION DIAGNOSIS:   Inadequate oral intake related to dysphagia as evidenced by per patient/family report.  GOAL:   Patient will meet greater than or equal to 90% of their needs  MONITOR:   PO intake, Supplement acceptance, Labs, Weight trends, Skin, I & O's  REASON FOR ASSESSMENT:   Consult Assessment of nutrition requirement/status  ASSESSMENT:   George Evans is a 61 y.o. male presenting with pill aspiration. PMH is significant for limb-girdle muscular dystrophy, multiple PEs, and previously treated hepatitis C.   Pt admitted with pill aspiration.   6/17- s/p bronchoscopy to remove foreign object 6/17- s/p BSE, advanced to dysphagia 3 diet with thin liquids, declined MBSS  Reviewed records from [facility] Pt was on a regular diet with chopped meats and received house supplement TID. PTA medications include senna and zofran.   Spoke with pt, who reports good appetite. He states that he consumed a gravy biscuit for breakfast, however, had difficulty with the sausage chunks. Pt reports he had a good appetite PTA, however, disliked the food at SNF. He confirms he was receiving Ensure PTA and is requesting to receive some during hospitalization.   Pt estimates he experienced a 10# wt loss approximately 3 months ago, however, no wt hx available to confirm this statement.   Nutrition-Focused physical exam completed. Findings are mild fat depletion, mild muscle depletion, and no edema. Pt with hx of muscular dystrophy and is bedbound. Per pt and family, pt was able to walk over 6 months ago and receives intermittent therapy from SNF. Fat and muscle wasting consistent in upper arm, shoulder, and legs, and likely related to limited use of muscle groups.   Discussed importance of good meal and supplement  intake to promote healing. Discussed importance of consuming supplements to maintain adequate nutrition.  Labs reviewed.   Diet Order:  DIET DYS 3 Room service appropriate? Yes; Fluid consistency: Thin  Skin:  Reviewed, no issues  Last BM:  11/26/16  Height:   Ht Readings from Last 1 Encounters:  11/27/16 6' (1.829 m)    Weight:   Wt Readings from Last 1 Encounters:  11/27/16 155 lb 3.3 oz (70.4 kg)    Ideal Body Weight:  80.9 kg  BMI:  Body mass index is 21.05 kg/m.  Estimated Nutritional Needs:   Kcal:  1750-1950  Protein:  90-105 grams  Fluid:  1.7-1.9 L  EDUCATION NEEDS:   Education needs addressed  [place] Rogel A. Mayford Knife, RD, LDN, CDE Pager: [phone] After hours Pager: [phone]

## 2016-11-30 DIAGNOSIS — G7109 Other specified muscular dystrophies: Secondary | ICD-10-CM

## 2016-11-30 DIAGNOSIS — G710 Muscular dystrophy: Secondary | ICD-10-CM

## 2016-11-30 LAB — CULTURE, RESPIRATORY W GRAM STAIN

## 2016-11-30 LAB — CULTURE, RESPIRATORY: CULTURE: NORMAL

## 2016-11-30 MED ORDER — GLYCOPYRROLATE 1 MG PO TABS: 0.5000 mg | ORAL_TABLET | Freq: Three times a day (TID) | ORAL | 0 refills | Status: DC

## 2016-11-30 MED ORDER — FLUTICASONE PROPIONATE 50 MCG/ACT NA SUSP: 2.0000 | Freq: Every day | NASAL | 0 refills | Status: AC

## 2016-11-30 NOTE — Clinical Social Work Placement (Signed)
   CLINICAL SOCIAL WORK PLACEMENT  NOTE  Date:  11/30/2016  Patient Details  Name: George Evans MRN: [id number] Date of Birth: [birth year]  Clinical Social Work is seeking post-discharge placement for this patient at the Skilled  Nursing Facility level of care (*CSW will initial, date and re-position this form in  chart as items are completed):  Yes   Patient/family provided with [facility] Clinical Social Work Department's list of facilities offering this level of care within the geographic area requested by the patient (or if unable, by the patient's family).  Yes   Patient/family informed of their freedom to choose among providers that offer the needed level of care, that participate in Medicare, Medicaid or managed care program needed by the patient, have an available bed and are willing to accept the patient.  Yes   Patient/family informed of [facility]'s ownership interest in [facility] and [facility], as well as of the fact that they are under no obligation to receive care at these facilities.  PASRR submitted to EDS on       PASRR number received on       Existing PASRR number confirmed on 11/30/16     FL2 transmitted to all facilities in geographic area requested by pt/family on 11/30/16     FL2 transmitted to all facilities within larger geographic area on       Patient informed that his/her managed care company has contracts with or will negotiate with certain facilities, including the following:        Yes   Patient/family informed of bed offers received.  Patient chooses bed at [facility]     Physician recommends and patient chooses bed at      Patient to be transferred to [facility] on 11/30/16.  Patient to be transferred to facility by ptar     Patient family notified on 11/30/16 of transfer.  Name of family member notified:  Marylu Lund     PHYSICIAN Please sign FL2     Additional Comment:     _______________________________________________ Burna Sis, LCSW 11/30/2016, 2:34 PM

## 2016-11-30 NOTE — Plan of Care (Signed)
Problem: Bowel/Gastric: Goal: Will not experience complications related to bowel motility Outcome: Not Progressing Patient c/o constipation and difficulty using bedpan, stool softener given per MD order and plan to attempt to use stedy lift to get from bed to [facility].

## 2016-11-30 NOTE — NC FL2 (Signed)
[place] MEDICAID FL2 LEVEL OF CARE SCREENING TOOL     IDENTIFICATION  Patient Name: George Evans Birthdate: Feb 07, 1956 Sex: male Admission Date (Current Location): 11/27/2016  [facility] and [place] Number:  Producer, television/film/video and Address:  [facility] [place], [address], [place], [place] [address]      Provider Number: [id number]  Attending Physician Name and Address:  Carney Living, MD  Relative Name and Phone Number:       Current Level of Care: Hospital Recommended Level of Care: Skilled Nursing Facility Prior Approval Number:    Date Approved/Denied:   PASRR Number: [id number]  Discharge Plan: SNF    Current Diagnoses: Patient Active Problem List   Diagnosis Date Noted  . MS (multiple sclerosis) (HCC)   . Aspiration into airway 11/27/2016  . Choking   . Muscular weakness   . Respiratory failure, chronic neuromuscular (HCC) 09/14/2016  . 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 Muscular dystrophies (HCC) 08/19/2016    Orientation RESPIRATION BLADDER Height & Weight     Self, Time, Situation, Place  O2, Other (Comment) (bipap at night) Continent Weight: 155 lb 3.3 oz (70.4 kg) Height:  6' (182.9 cm)  BEHAVIORAL SYMPTOMS/MOOD NEUROLOGICAL BOWEL NUTRITION STATUS      Continent Diet (mechanical soft)  AMBULATORY STATUS COMMUNICATION OF NEEDS Skin   Total Care Verbally Normal                       Personal Care Assistance Level of Assistance  Bathing, Dressing, Total care Bathing Assistance: Maximum assistance   Dressing Assistance: Maximum assistance Total Care Assistance: Maximum assistance   Functional Limitations Info             SPECIAL CARE FACTORS FREQUENCY  PT (By licensed PT), OT (By licensed OT)     PT Frequency: 5/wk OT Frequency: 5/wk            Contractures      Additional Factors Info  Code Status, Allergies Code Status Info: FULL Allergies Info: Coconut Oil            Current Medications (11/30/2016):  This is the current hospital active medication list Current Facility-Administered Medications  Medication Dose Route Frequency Provider Last Rate Last Dose  . 0.9 %  sodium chloride infusion   Intravenous Continuous Casey Burkitt, MD 75 mL/hr at 11/30/16 [phone]    . chlorhexidine (PERIDEX) 0.12 % solution 15 mL  15 mL Mouth Rinse BID Carney Living, MD   15 mL at 11/29/16 2212  . feeding supplement (ENSURE ENLIVE) (ENSURE ENLIVE) liquid 237 mL  237 mL Oral TID BM Carney Living, MD   237 mL at 11/29/16 2212  . fluticasone (FLONASE) 50 MCG/ACT nasal spray 2 spray  2 spray Each Nare Daily Erasmo Downer, MD   2 spray at 11/29/16 1000  . guaiFENesin (ROBITUSSIN) 100 MG/5ML solution 300 mg  15 mL Oral Q6H Chambliss, Estill Batten, MD   300 mg at 11/30/16 0610  . MEDLINE mouth rinse  15 mL Mouth Rinse q12n4p Chambliss, Estill Batten, MD   15 mL at 11/29/16 1627  . rivaroxaban (XARELTO) tablet 20 mg  20 mg Oral Q supper Casey Burkitt, MD   20 mg at 11/29/16 1736  . senna (SENOKOT) tablet 8.6 mg  1 tablet Oral QHS Dolores Patty C, DO   8.6 mg at 11/29/16 2212     Discharge Medications: Please see  discharge summary for a list of discharge medications.  Relevant Imaging Results:  Relevant Lab Results:   Additional Information SS#: [id number]  Burna Sis, LCSW

## 2016-11-30 NOTE — Clinical Social Work Note (Signed)
Clinical Social Work Assessment  Patient Details  Name: George Evans MRN: [id number] Date of Birth: 08-28-1955  Date of referral:  11/29/16               Reason for consult:  Discharge Planning                Permission sought to share information with:  Family Supports Permission granted to share information::  Yes, Verbal Permission Granted  Name::     Jasmin Trumbull  Agency::  SNFs  Relationship::  Wife  Contact Information:  [phone]  Housing/Transportation Living arrangements for the past 2 months:  Single Family Home Source of Information:  Patient Patient Interpreter Needed:  None Criminal Activity/Legal Involvement Pertinent to Current Situation/Hospitalization:  No - Comment as needed Significant Relationships:  Spouse, Siblings Lives with:  Facility Resident Do you feel safe going back to the place where you live?  Yes Need for family participation in patient care:  No (Coment)  Care giving concerns:  No care giving concerns identified.    Social Worker assessment / plan:  CSW met with pt, spouse, and sister at bedside yesterday to address consult for "Needs SNF with respiratory care available for special Bipap needs overnight; Skilled Nursing facility placement." (See CSW progress note from 6/18.)   Pt agreeable to SNF other than [facility]. Plan for discharge is dependent upon pt's tolerance of bipap machine overnight. Tammy ([facility] admissions) confirmed [facility] in [place] can accommodate a trilogy pt. However, family seeking closer SNF that can provide Trilogy management. CSW will perform bed search based on MD/pulmonolgy recommendation of Bipap vs Trilogy.   Pt has been transferred to [facility]. CSW briefed unit CSW about pt. Unit CSW will continue to follow.    Employment status:  Retired Science writer) PT Recommendations:  Not assessed at this time Information / Referral to community resources:  [place]  Patient/Family's Response to care:  Pt and family appreciative of CSW support and guidance.   Patient/Family's Understanding of and Emotional Response to Diagnosis, Current Treatment, and Prognosis:  Pt and family express understanding of diagnosis, prognosis, and current treatment. Pt and family hopeful that pt will continue to improve nightly breathing through current treatment plan.   Emotional Assessment Appearance:  Appears stated age Attitude/Demeanor/Rapport:  Other (Appropriate) Affect (typically observed):  Accepting, Adaptable, Pleasant, Hopeful Orientation:  Oriented to Self, Oriented to Place, Oriented to  Time, Oriented to Situation Alcohol / Substance use:  Other Psych involvement (Current and /or in the community):  No (Comment)  Discharge Needs  Concerns to be addressed:  Care Coordination, Discharge Planning Concerns Readmission within the last 30 days:  No Current discharge risk:  Dependent with Mobility Barriers to Discharge:  Continued Medical Work up   [employer], LCSW 11/30/2016, 8:54 AM

## 2016-11-30 NOTE — Progress Notes (Signed)
Patient will discharge to [facility] Anticipated discharge date: 6/19 Family notified: Marylu [facility] Transportation by Sharin Mons- called at 2:30pm- PTAR informed CSW they are very behind at this time likely will be several hours until pick up  CSW signing off.  Burna Sis, LCSW Clinical Social Worker [phone]

## 2016-11-30 NOTE — Care Management Note (Signed)
Case Management Note  Patient Details  Name: George Evans MRN: [id number] Date of Birth: [birth year]  Subjective/Objective:   From [facility],presents with  Aspiration into airway,  CSW referral.    PCP Stephanie Acre                Action/Plan: NCM will follow along with CSW for dc needs.   Expected Discharge Date:  11/30/16               Expected Discharge Plan:  Skilled Nursing Facility  In-House Referral:  Clinical Social Work  Discharge planning Services  CM Consult  Post Acute Care Choice:    Choice offered to:     DME Arranged:    DME Agency:     HH Arranged:    HH Agency:     Status of Service:  Completed, signed off  If discussed at [employer], dates discussed:    Additional Comments:  Leone Haven, RN 11/30/2016, 12:09 PM

## 2016-12-06 ENCOUNTER — Inpatient Hospital Stay (HOSPITAL_COMMUNITY)
Admission: EM | Admit: 2016-12-06 | Discharge: 2017-01-12 | DRG: 004 | Disposition: E | Payer: Medicare Other | Attending: Family Medicine | Admitting: Family Medicine

## 2016-12-06 ENCOUNTER — Emergency Department (HOSPITAL_COMMUNITY): Payer: Medicare Other

## 2016-12-06 ENCOUNTER — Encounter (HOSPITAL_COMMUNITY): Payer: Self-pay | Admitting: Emergency Medicine

## 2016-12-06 ENCOUNTER — Telehealth: Payer: Self-pay | Admitting: Pulmonary Disease

## 2016-12-06 DIAGNOSIS — I959 Hypotension, unspecified: Secondary | ICD-10-CM | POA: Diagnosis not present

## 2016-12-06 DIAGNOSIS — Z833 Family history of diabetes mellitus: Secondary | ICD-10-CM | POA: Diagnosis not present

## 2016-12-06 DIAGNOSIS — Z681 Body mass index (BMI) 19 or less, adult: Secondary | ICD-10-CM

## 2016-12-06 DIAGNOSIS — G709 Myoneural disorder, unspecified: Secondary | ICD-10-CM | POA: Diagnosis present

## 2016-12-06 DIAGNOSIS — T17890A Other foreign object in other parts of respiratory tract causing asphyxiation, initial encounter: Secondary | ICD-10-CM | POA: Diagnosis not present

## 2016-12-06 DIAGNOSIS — Z9189 Other specified personal risk factors, not elsewhere classified: Secondary | ICD-10-CM | POA: Diagnosis not present

## 2016-12-06 DIAGNOSIS — J189 Pneumonia, unspecified organism: Secondary | ICD-10-CM

## 2016-12-06 DIAGNOSIS — J9811 Atelectasis: Secondary | ICD-10-CM | POA: Diagnosis not present

## 2016-12-06 DIAGNOSIS — E876 Hypokalemia: Secondary | ICD-10-CM | POA: Diagnosis present

## 2016-12-06 DIAGNOSIS — Z7189 Other specified counseling: Secondary | ICD-10-CM

## 2016-12-06 DIAGNOSIS — R64 Cachexia: Secondary | ICD-10-CM | POA: Diagnosis not present

## 2016-12-06 DIAGNOSIS — Z9289 Personal history of other medical treatment: Secondary | ICD-10-CM | POA: Diagnosis not present

## 2016-12-06 DIAGNOSIS — E440 Moderate protein-calorie malnutrition: Secondary | ICD-10-CM | POA: Diagnosis not present

## 2016-12-06 DIAGNOSIS — I739 Peripheral vascular disease, unspecified: Secondary | ICD-10-CM | POA: Diagnosis present

## 2016-12-06 DIAGNOSIS — Z86711 Personal history of pulmonary embolism: Secondary | ICD-10-CM

## 2016-12-06 DIAGNOSIS — Z7901 Long term (current) use of anticoagulants: Secondary | ICD-10-CM

## 2016-12-06 DIAGNOSIS — E46 Unspecified protein-calorie malnutrition: Secondary | ICD-10-CM | POA: Diagnosis present

## 2016-12-06 DIAGNOSIS — J9600 Acute respiratory failure, unspecified whether with hypoxia or hypercapnia: Secondary | ICD-10-CM | POA: Diagnosis not present

## 2016-12-06 DIAGNOSIS — Z808 Family history of malignant neoplasm of other organs or systems: Secondary | ICD-10-CM | POA: Diagnosis not present

## 2016-12-06 DIAGNOSIS — J9621 Acute and chronic respiratory failure with hypoxia: Secondary | ICD-10-CM | POA: Diagnosis present

## 2016-12-06 DIAGNOSIS — Z8701 Personal history of pneumonia (recurrent): Secondary | ICD-10-CM | POA: Diagnosis not present

## 2016-12-06 DIAGNOSIS — R1310 Dysphagia, unspecified: Secondary | ICD-10-CM

## 2016-12-06 DIAGNOSIS — F419 Anxiety disorder, unspecified: Secondary | ICD-10-CM | POA: Diagnosis present

## 2016-12-06 DIAGNOSIS — R0603 Acute respiratory distress: Secondary | ICD-10-CM

## 2016-12-06 DIAGNOSIS — Z515 Encounter for palliative care: Secondary | ICD-10-CM

## 2016-12-06 DIAGNOSIS — J9622 Acute and chronic respiratory failure with hypercapnia: Secondary | ICD-10-CM | POA: Diagnosis not present

## 2016-12-06 DIAGNOSIS — J9690 Respiratory failure, unspecified, unspecified whether with hypoxia or hypercapnia: Secondary | ICD-10-CM

## 2016-12-06 DIAGNOSIS — R0602 Shortness of breath: Secondary | ICD-10-CM | POA: Diagnosis present

## 2016-12-06 DIAGNOSIS — G35 Multiple sclerosis: Secondary | ICD-10-CM | POA: Diagnosis present

## 2016-12-06 DIAGNOSIS — K5900 Constipation, unspecified: Secondary | ICD-10-CM | POA: Diagnosis not present

## 2016-12-06 DIAGNOSIS — Z87891 Personal history of nicotine dependence: Secondary | ICD-10-CM

## 2016-12-06 DIAGNOSIS — G710 Muscular dystrophy: Secondary | ICD-10-CM | POA: Diagnosis present

## 2016-12-06 DIAGNOSIS — Z66 Do not resuscitate: Secondary | ICD-10-CM | POA: Diagnosis not present

## 2016-12-06 LAB — BASIC METABOLIC PANEL
Anion gap: 15 (ref 5–15)
BUN: 8 mg/dL (ref 6–20)
CALCIUM: 10.1 mg/dL (ref 8.9–10.3)
CO2: 24 mmol/L (ref 22–32)
CREATININE: 0.39 mg/dL — AB (ref 0.61–1.24)
Chloride: 103 mmol/L (ref 101–111)
GFR calc Af Amer: >60 mL/min (ref >60)
Glucose, Bld: 91 mg/dL (ref 65–99)
Potassium: 3.4 mmol/L — ABNORMAL LOW (ref 3.5–5.1)
SODIUM: 142 mmol/L (ref 135–145)

## 2016-12-06 LAB — CBC WITH DIFFERENTIAL/PLATELET
Basophils Absolute: 0 10*3/uL (ref 0.0–0.1)
Basophils Relative: 0 %
EOS ABS: 0 10*3/uL (ref 0.0–0.7)
EOS PCT: 0 %
HCT: 50.6 % (ref 39.0–52.0)
Hemoglobin: 16.6 g/dL (ref 13.0–17.0)
LYMPHS ABS: 0.9 10*3/uL (ref 0.7–4.0)
Lymphocytes Relative: 10 %
MCH: 28.5 pg (ref 26.0–34.0)
MCHC: 32.8 g/dL (ref 30.0–36.0)
MCV: 86.9 fL (ref 78.0–100.0)
MONOS PCT: 9 %
Monocytes Absolute: 0.8 10*3/uL (ref 0.1–1.0)
Neutro Abs: 6.9 10*3/uL (ref 1.7–7.7)
Neutrophils Relative %: 81 %
Platelets: 298 10*3/uL (ref 150–400)
RBC: 5.82 MIL/uL — AB (ref 4.22–5.81)
RDW: 16.7 % — ABNORMAL HIGH (ref 11.5–15.5)
WBC: 8.6 10*3/uL (ref 4.0–10.5)

## 2016-12-06 LAB — I-STAT ARTERIAL BLOOD GAS, ED
ACID-BASE DEFICIT: 3 mmol/L — AB (ref 0.0–2.0)
Bicarbonate: 20.7 mmol/L (ref 20.0–28.0)
O2 SAT: 97 %
PH ART: 7.408 (ref 7.350–7.450)
Patient temperature: 98.6
TCO2: 22 mmol/L (ref 0–100)
pCO2 arterial: 32.8 mmHg (ref 32.0–48.0)
pO2, Arterial: 92 mmHg (ref 83.0–108.0)

## 2016-12-06 LAB — HEPATIC FUNCTION PANEL
ALK PHOS: 68 U/L (ref 38–126)
ALT: 23 U/L (ref 17–63)
AST: 22 U/L (ref 15–41)
Albumin: 3.9 g/dL (ref 3.5–5.0)
BILIRUBIN INDIRECT: 0.9 mg/dL (ref 0.3–0.9)
Bilirubin, Direct: 0.3 mg/dL (ref 0.1–0.5)
TOTAL PROTEIN: 7.7 g/dL (ref 6.5–8.1)
Total Bilirubin: 1.2 mg/dL (ref 0.3–1.2)

## 2016-12-06 LAB — I-STAT TROPONIN, ED: TROPONIN I, POC: 0.01 ng/mL (ref 0.00–0.08)

## 2016-12-06 LAB — MRSA PCR SCREENING: MRSA BY PCR: NEGATIVE

## 2016-12-06 MED ORDER — POTASSIUM CHLORIDE 10 MEQ/100ML IV SOLN
10.0000 meq | INTRAVENOUS | Status: AC
  Filled 2016-12-06: qty 100

## 2016-12-06 MED ORDER — FLUTICASONE PROPIONATE 50 MCG/ACT NA SUSP
2.0000 | Freq: Every day | NASAL | Status: DC
  Administered 2016-12-08 – 2016-12-17 (×4): 2 via NASAL
  Filled 2016-12-06: qty 16

## 2016-12-06 MED ORDER — SENNOSIDES-DOCUSATE SODIUM 8.6-50 MG PO TABS
1.0000 | ORAL_TABLET | Freq: Every day | ORAL | Status: DC
  Administered 2016-12-06: 1 via ORAL
  Filled 2016-12-06 (×2): qty 1

## 2016-12-06 MED ORDER — GLYCOPYRROLATE 1 MG PO TABS: 0.5000 mg | ORAL_TABLET | Freq: Three times a day (TID) | ORAL | Status: DC

## 2016-12-06 MED ORDER — ALBUTEROL SULFATE (2.5 MG/3ML) 0.083% IN NEBU
2.5000 mg | INHALATION_SOLUTION | Freq: Two times a day (BID) | RESPIRATORY_TRACT | Status: DC
  Administered 2016-12-06 – 2016-12-19 (×26): 2.5 mg via RESPIRATORY_TRACT
  Filled 2016-12-06 (×26): qty 3

## 2016-12-06 MED ORDER — GUAIFENESIN 100 MG/5ML PO SYRP: 200.0000 mg | ORAL_SOLUTION | Freq: Four times a day (QID) | ORAL | Status: DC | PRN

## 2016-12-06 MED ORDER — ACETYLCYSTEINE 20 % IN SOLN
4.0000 mL | Freq: Two times a day (BID) | RESPIRATORY_TRACT | Status: DC
  Administered 2016-12-06 – 2016-12-09 (×6): 4 mL via RESPIRATORY_TRACT
  Filled 2016-12-06 (×6): qty 4

## 2016-12-06 MED ORDER — GLYCOPYRROLATE 1 MG PO TABS
0.5000 mg | ORAL_TABLET | Freq: Three times a day (TID) | ORAL | Status: DC
  Administered 2016-12-06: 0.5 mg via ORAL
  Filled 2016-12-06 (×2): qty 1

## 2016-12-06 MED ORDER — FLUTICASONE PROPIONATE 50 MCG/ACT NA SUSP: 2.0000 | Freq: Every day | NASAL | Status: DC

## 2016-12-06 MED ORDER — RIVAROXABAN 20 MG PO TABS
20.0000 mg | ORAL_TABLET | Freq: Every day | ORAL | Status: DC
  Administered 2016-12-06: 20 mg via ORAL
  Filled 2016-12-06: qty 1

## 2016-12-06 MED ORDER — DEXTROSE-NACL 5-0.9 % IV SOLN
INTRAVENOUS | Status: DC
  Administered 2016-12-06 – 2016-12-08 (×3): via INTRAVENOUS
  Filled 2016-12-06 (×4): qty 1000

## 2016-12-06 MED ORDER — GUAIFENESIN 100 MG/5ML PO SOLN: 5.0000 mL | ORAL | Status: DC | PRN

## 2016-12-06 NOTE — Progress Notes (Signed)
Patient performed NIF -25 x3. Patient is very weak at this time.

## 2016-12-06 NOTE — Telephone Encounter (Signed)
lmtcb x1 for Amanda 

## 2016-12-06 NOTE — ED Notes (Signed)
MDs still at bedside at this time.

## 2016-12-06 NOTE — ED Provider Notes (Signed)
[facility]-EMERGENCY DEPT Provider Note   CSN: [id number] Arrival date & time: 12/25/2016  1206     History   Chief Complaint Chief Complaint  Patient presents with  . Shortness of Breath    HPI George Evans is a 61 y.o. male.  Patient with history of muscular dystrophy with respiratory muscle weakness, history of respiratory failure, pulmonary emboli on Xarelto -- presents from rehabilitation facility with complaints of worsening shortness of breath and a sensation that there is mucus and secretions stuck in his chest. Family notes that patient is so weak that he is unable to expel any secretions. Patient's breathing has become more fatigued today. He is speaking in 1-2 word sentences. No fever or URI symptoms. Patient has some lower chest pain which he attributes to respiratory effort. No nausea, vomiting, or diarrhea. Patient is currently on Flonase and Robitussin. He received a dose of Robinul last night and per phone records, PCP at facility was worried that he had a mucous plug causing increased respiratory distress.      Past Medical History:  Diagnosis Date  . Abnormal posture   . Chronic viral hepatitis C (HCC)   . Constipation   . Difficulty walking   . Distal muscular dystrophy (HCC)   . 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 Major depressive disorder   . Muscle wasting   . Muscle weakness   . Other diseases of bronchus, not elsewhere classified (CODE)   . Peripheral vascular disease (HCC)   . Pressure ulcer of sacral region     Patient Active Problem List   Diagnosis Date Noted  . 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 MS (multiple sclerosis) (HCC)   . Aspiration into airway 11/27/2016  . Choking   . Muscular weakness   . Respiratory failure, chronic neuromuscular (HCC) 09/14/2016  . 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 Muscular dystrophies (HCC) 08/19/2016    Past Surgical History:  Procedure  Laterality Date  . APPENDECTOMY  1992  . OTHER SURGICAL HISTORY  2016   Abcess removed from penis  . ROTATOR CUFF REPAIR Right 1989       Home Medications    Prior to Admission medications   Medication Sig Start Date End Date Taking? Authorizing Provider  cholecalciferol (VITAMIN D) 1000 units tablet Take 1,000 Units by mouth daily.    [provider]  fluticasone (FLONASE) 50 MCG/ACT nasal spray Place 2 sprays into both nostrils daily. 12/01/16   Marquette Saa, MD  glycopyrrolate (ROBINUL) 1 MG tablet Take 0.5 tablets (0.5 mg total) by mouth every 8 (eight) hours. 11/30/16   Marquette Saa, MD  guaifenesin (ROBITUSSIN) 100 MG/5ML syrup Take 200 mg by mouth every 6 (six) hours as needed for cough.    [provider]  ondansetron (ZOFRAN) 4 MG tablet Take 4 mg by mouth every 8 (eight) hours as needed for nausea or vomiting.    [provider]  rivaroxaban (XARELTO) 20 MG TABS tablet Take 20 mg by mouth daily.    [provider]  senna (SENOKOT) 8.6 MG tablet Take 2 tablets by mouth at bedtime. Hold for loose stools    [provider]    Family History Family History  Problem Relation Age of Onset  . Diabetes Mother   . Cancer Sister        thyroid  . Cancer Sister        thyroid    Social History  Social History  Substance Use Topics  . Smoking status: Former Smoker    Packs/day: 2.00    Years: 40.00    Quit date: 06/14/2009  . Smokeless tobacco: Never Used  . Alcohol use No     Allergies   Coconut oil   Review of Systems Review of Systems  Constitutional: Negative for fever.  HENT: Positive for congestion. Negative for rhinorrhea and sore throat.   Eyes: Negative for redness.  Respiratory: Positive for shortness of breath. Negative for cough.   Cardiovascular: Negative for chest pain.  Gastrointestinal: Negative for abdominal pain, diarrhea, nausea and vomiting.  Genitourinary: Negative for  dysuria.  Musculoskeletal: Negative for myalgias.  Skin: Negative for rash.  Neurological: Negative for headaches.     Physical Exam Updated Vital Signs BP 116/74   Pulse (!) 114   Temp 97.6 F (36.4 C) (Oral)   Resp (!) 29   Ht 6' (1.829 m)   Wt 70.3 kg (155 lb)   SpO2 100%   BMI 21.02 kg/m   Physical Exam  Constitutional: He appears well-developed and well-nourished.  HENT:  Head: Normocephalic and atraumatic.  Mouth/Throat: Oropharynx is clear and moist.  Eyes: Conjunctivae are normal. Right eye exhibits no discharge. Left eye exhibits no discharge.  Neck: Normal range of motion. Neck supple.  Cardiovascular: Regular rhythm and normal heart sounds.  Tachycardia present.   Pulmonary/Chest: Accessory muscle usage present. He is in respiratory distress. He has decreased breath sounds in the right upper field, the right middle field, the right lower field, the left upper field, the left middle field and the left lower field.  Patient speaking in 1-2 word sentences. Shallow respirations.  Abdominal: Soft. There is no tenderness.  Musculoskeletal: He exhibits no edema.  Neurological: He is alert.  Skin: Skin is warm and dry.  Psychiatric: He has a normal mood and affect.  Nursing note and vitals reviewed.    ED Treatments / Results  Labs (all labs ordered are listed, but only abnormal results are displayed) Labs Reviewed  CBC WITH DIFFERENTIAL/PLATELET - Abnormal; Notable for the following:       Result Value   RBC 5.82 (*)    RDW 16.7 (*)    All other components within normal limits  BASIC METABOLIC PANEL - Abnormal; Notable for the following:    Potassium 3.4 (*)    Creatinine, Ser 0.39 (*)    All other components within normal limits  BASIC METABOLIC PANEL - Abnormal; Notable for the following:    Potassium 2.8 (*)    Glucose, Bld 148 (*)    BUN 5 (*)    Creatinine, Ser <0.30 (*)    All other components within normal limits  MAGNESIUM - Abnormal; Notable for  the following:    Magnesium 1.5 (*)    All other components within normal limits  BASIC METABOLIC PANEL - Abnormal; Notable for the following:    Potassium <2.0 (*)    Glucose, Bld 138 (*)    BUN <5 (*)    Creatinine, Ser <0.30 (*)    All other components within normal limits  BASIC METABOLIC PANEL - Abnormal; Notable for the following:    Potassium 2.4 (*)    BUN <5 (*)    Creatinine, Ser <0.30 (*)    All other components within normal limits  I-STAT ARTERIAL BLOOD GAS, ED - Abnormal; Notable for the following:    Acid-base deficit 3.0 (*)    All other components within normal limits  MRSA PCR SCREENING  HEPATIC FUNCTION PANEL  TSH  BASIC METABOLIC PANEL  I-STAT TROPOININ, ED    EKG  EKG Interpretation  Date/Time:  Monday 2017-01-01 12:21:05 EDT Ventricular Rate:  111 PR Interval:    QRS Duration: 96 QT Interval:  319 QTC Calculation: 434 R Axis:   42 Text Interpretation:  Sinus tachycardia Posterior infarct, old No previous ECGs available Confirmed by Alvira Monday ([id number]) on Jan 01, 2017 12:59:20 PM       Radiology Dg Chest 1 View  Result Date: 2017/01/01 CLINICAL DATA:  Shortness of breath and cough EXAM: CHEST 1 VIEW COMPARISON:  Chest radiograph 11/27/2016 FINDINGS: Unchanged elevation of the left hemidiaphragm with associated left basilar atelectasis. Lungs are otherwise clear. Normal cardiomediastinal contours. IMPRESSION: Unchanged left hemidiaphragmatic elevation and associated atelectasis. Electronically Signed   By: Deatra Robinson M.D.   On: 01-01-17 13:46    Procedures Procedures (including critical care time)  Medications Ordered in ED Medications - No data to display   Initial Impression / Assessment and Plan / ED Course  I have reviewed the triage vital signs and the nursing notes.  Pertinent labs & imaging results that were available during my care of the patient were reviewed by me and considered in my medical decision making (see chart for  details).     Patient seen and examined. Work-up initiated. CXR ordered.   Vital signs reviewed and are as follows: BP 116/74   Pulse (!) 114   Temp 97.6 F (36.4 C) (Oral)   Resp (!) 29   Ht 6' (1.829 m)   Wt 70.3 kg (155 lb)   SpO2 100%   BMI 21.02 kg/m   Seen by RT. Discussed with and seen by Dr. Dalene Seltzer.   NIF ordered -- 16-17  Due to worsening respiratory fatigue, patient restarted on BiPAP. He appears more comfortable now that he is on this.  Discussed case with Dr. Craige Cotta, pulmonary to consult. Will readmit to family practice.  Family practice to see patient.  Final Clinical Impressions(s) / ED Diagnoses   Final diagnoses:  Acute neuromuscular respiratory failure (HCC)   Admit - Worsening respiratory distress in setting of muscular dystrophy.  Labs are pending.   New Prescriptions New Prescriptions   No medications on file     Renne Crigler, Cordelia Poche 12/08/16 1304    Alvira Monday, MD 12/10/16 [phone]

## 2016-12-06 NOTE — ED Notes (Signed)
Patient transported to X-ray 

## 2016-12-06 NOTE — Telephone Encounter (Signed)
Spoke with Marchelle Folks. Patient was seen by the doctor at [facility] Dr there stated that the Robinul may have caused a mucus plug to form somewhere between his mouth and lungs. Patient was transported to [facility] ED. She wanted to make PM aware.    She also expressed concerns for the patient. She thinks that the patient would be a great candidate for a Triology vent but [facility] does not have a respiratory tech staffed. And because of that and the severity of his lungs, she questions if that is the best place for him.   PM, please advise. Thanks.

## 2016-12-06 NOTE — ED Notes (Signed)
MDs at bedside at this time with patient/family.

## 2016-12-06 NOTE — Progress Notes (Signed)
Pt complaining of burning in left arm up to the neck after receiving IV potassium chloride. Infusion stopped and MD notified. MD ordered to hold potassium until morning labs and reassess in am.

## 2016-12-06 NOTE — ED Triage Notes (Signed)
Pt from [facility] for Shortness of breath. Pt feels like he has a mucus plug in his chest

## 2016-12-06 NOTE — Telephone Encounter (Signed)
Marchelle Folks returned call, CB is [phone].

## 2016-12-06 NOTE — Consult Note (Signed)
Name: George Evans MRN: [id number] DOB: 1955-10-02    ADMISSION DATE:  December 11, 2016 CONSULTATION DATE:  6/25   REFERRING MD :  Dr. 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 His muscular dystrophy has been starting to affect his respiratory muscles. He was recently admitted to [facility] 6/16 > 6/19 after he was thought to have aspirated a pill. This was not found to be the case on CXR or bronchoscopy. Course complicated by acute on chronic respiratory failure secondary to excessive secretions and progression of MD. He was started on Trilogy Vent, Chest PT, and suctioning with improvement. Unable to be discharged on Trilogy so he was recommended nocturnal BiPAP, which he tolerated well and was discharged to [facility].  Since discharge he has been unable to use BiPAP due to some issue at facility. Initially he was doing OK, however, 6/25 he developed acute onset dyspnea for which he presented to [facility] Emergency department. He complains of sputum he is unable to clear sitting in his chest. He does not feel as though this is food/aspiration related. In the emergency department he was started on BiPAP, which did successfully ease his work of breathing. He was admitted to the [facility] Teaching [facility]. PCCM was asked to see for further evaluation.  SIGNIFICANT EVENTS  6/16 > 6/19 admit for respiratory failure and ? Pill aspiration. 6/25 > admit with dyspnea  STUDIES:  Admit CXR > L hemidiaphragmatic elevation and associated ATX  PAST MEDICAL HISTORY :   has a past medical history of Abnormal posture; Chronic viral hepatitis C (HCC); Constipation; Difficulty walking; Distal muscular dystrophy (HCC); Limb-girdle muscular dystrophy (HCC) (08/19/2016); Major depressive disorder; Muscle wasting; Muscle weakness;  Other diseases of bronchus, not elsewhere classified (CODE); Peripheral vascular disease (HCC); and Pressure ulcer of sacral region.  has a past surgical history that includes Rotator cuff repair (Right, 1989); Appendectomy (1992); and OTHER SURGICAL HISTORY (2016). Prior to Admission medications   Medication Sig Start Date End Date Taking? Authorizing Provider  cholecalciferol (VITAMIN D) 1000 units tablet Take 1,000 Units by mouth daily.    [provider]  fluticasone (FLONASE) 50 MCG/ACT nasal spray Place 2 sprays into both nostrils daily. 12/01/16   Marquette Saa, MD  glycopyrrolate (ROBINUL) 1 MG tablet Take 0.5 tablets (0.5 mg total) by mouth every 8 (eight) hours. 11/30/16   Marquette Saa, MD  guaifenesin (ROBITUSSIN) 100 MG/5ML syrup Take 200 mg by mouth every 6 (six) hours as needed for cough.    [provider]  ondansetron (ZOFRAN) 4 MG tablet Take 4 mg by mouth every 8 (eight) hours as needed for nausea or vomiting.    [provider]  rivaroxaban (XARELTO) 20 MG TABS tablet Take 20 mg by mouth daily.    [provider]  senna (SENOKOT) 8.6 MG tablet Take 2 tablets by mouth at bedtime. Hold for loose stools    [provider]   Allergies  Allergen Reactions  . Coconut Oil Swelling    FAMILY HISTORY:  family history includes Cancer in his sister and sister; Diabetes in his mother. SOCIAL HISTORY:  reports that he quit smoking about 7 years ago. He has a 80.00 pack-year smoking history. He has never used smokeless tobacco. He reports that he does not drink alcohol or use  drugs.  REVIEW OF SYSTEMS:   Bolds are positive  Constitutional: weight loss, gain, night sweats, Fevers, chills, fatigue .  HEENT: headaches, Sore throat, sneezing, nasal congestion, post nasal drip, Difficulty swallowing, Tooth/dental problems, visual complaints visual changes, ear ache CV:  chest pain, radiates:,Orthopnea, PND, swelling in  lower extremities, dizziness, palpitations, syncope.  GI  heartburn, indigestion, abdominal pain, nausea, vomiting, diarrhea, change in bowel habits, loss of appetite, bloody stools.  Resp: cough, productive:, hemoptysis, dyspnea, chest pain, pleuritic.  Skin: rash or itching or icterus GU: dysuria, change in color of urine, urgency or frequency. flank pain, hematuria  MS: joint pain or swelling. decreased range of motion  Psych: change in mood or affect. depression or anxiety.  Neuro: difficulty with speech, weakness, numbness, ataxia    SUBJECTIVE:   VITAL SIGNS: Temp:  [97.6 F (36.4 C)] 97.6 F (36.4 C) (06/25 1430) Pulse Rate:  [105-119] 114 (06/25 1515) Resp:  [15-35] 29 (06/25 1515) BP: (101-116)/(74-100) 116/74 (06/25 1515) SpO2:  [99 %-100 %] 100 % (06/25 1515) Weight:  [70.3 kg (155 lb)] 70.3 kg (155 lb) (06/25 1211)  PHYSICAL EXAMINATION: General:  Frail adult male in mild resp ditress Neuro:  Alert, oriented, able to follow commands and respond appropriately. Non-Focal.  HEENT:  [place]/AT, PERRL, no JVD Cardiovascular:  RRR, no MRG Lungs:  Some rhonchi. Air movement decreased on R compared to L. Tachypnea rate mid 20s.  Abdomen:  Soft, non-tender, non-distended Musculoskeletal:  No acute deformity Skin:  Grossly intact  No results for input(s): NA, K, CL, CO2, BUN, CREATININE, GLUCOSE in the last 168 hours. No results for input(s): HGB, HCT, WBC, PLT in the last 168 hours. Dg Chest 1 View  Result Date: 11/12/2016 CLINICAL DATA:  Shortness of breath and cough EXAM: CHEST 1 VIEW COMPARISON:  Chest radiograph 11/27/2016 FINDINGS: Unchanged elevation of the left hemidiaphragm with associated left basilar atelectasis. Lungs are otherwise clear. Normal cardiomediastinal contours. IMPRESSION: Unchanged left hemidiaphragmatic elevation and associated atelectasis. Electronically Signed   By: Deatra Robinson M.D.   On: 11/14/2016 13:46    ASSESSMENT / PLAN:  Acute on chronic  respiratory failure: in the setting of progressive muscular dystrophy complicated by impaired clearance of respiratory secretions.  - PRN BiPAP only for moderate to severe distress, needs opportunity to clear secretions - QHS BiPAP mandatory - Supplemental O2 to maintain SpO2 > 90% - Chest PT with vest - Mucomyst nebulizer with albuterol - Robitussin - Rubinol oral - Flonase at patient request - Incentive spirometry and flutter valve if he is able.   Malnutrition: He is already very behind in this regard and a decision will need to be made in the next 24> 48 hours how we will provide him nutrition - SLP evaluation - Diet per primary team  Joneen Roach, AGACNP-BC [facility] Pulmonology/Critical Care Pager [phone] or [phone]  11/30/2016 4:54 PM

## 2016-12-06 NOTE — ED Notes (Signed)
RT at bedside at this time.

## 2016-12-06 NOTE — Telephone Encounter (Signed)
Marchelle Folks called back advised that pt has possible mucous plug somewhere between mouth and lungs - she is sending patient to the ED - [facility] - she can be reached at [phone] -pr

## 2016-12-06 NOTE — Progress Notes (Signed)
Pt scheduled to receive Xarelto at 2200. pt received Xarelto at 2201. Pharmacy informed RN that pt receive Xarelto at [phone] at [facility]. MD notified. Pt also suctioned most of the pill out because he was coughing.  Will continue to monitor.

## 2016-12-06 NOTE — ED Notes (Signed)
Attempted to call report to [address] x 1.

## 2016-12-06 NOTE — H&P (Signed)
Family Medicine Teaching [facility] Admission History and Physical Service Pager: [phone]  Patient name: George Evans Medical record number: [id number] Date of birth: 09/22/1955 Age: 61 y.o. Gender: male  Primary Care Provider: York Spaniel, MD Consultants: CCM Code Status: Full  Chief Complaint: SOB  Assessment and Plan: George Evans is a 61 y.o. male presenting with SOB . PMH is significant for limb-girdle muscular dystrophy, multiple PEs, and previously treated hepatitis C.   Acute respiratory failure 2/2 mucus plugging. Presents with IWOB and inability to handle secretions, intermittently requiring BiPAP in ED. Patient was not able to get Trilogy ventilator at SNF and nightly BiPAP has not been functioning appropriately. Follows with Pulmonologist Dr. Isaiah Serge. - Admit to SDU, attending Dr. Gwendolyn Grant - CCM following, appreciate recommendations  - BiPAP nightly and prn during the day for IWOB (i.e. RR>35). Should not use BiPAP for >2 hours at a time.  - Continue robinul at decreased dose of 0.5mg  TID  - Chest PT via vest, suctioning, mucomyst nebs BID  - Continue home guaifenesin and flonase - Albuterol nebs bid - NIFs q12hrs - Monitor on telemetry - Continuous pulse ox - Palliative consult  Limb-girdle muscular dystrophy. Follows with Neurologist Dr. Anne Hahn. Progressive weakness resulting in swallowing and breathing difficulties at this time. Last BM was Thursday, on senna at SNF, but has not been eating much d/t difficulty with swallowing - Dysphagia 3 diet, thin liquids, meds crushed with puree per SLP recs last hospitalization - Senna-docusate qhs  Tachycardia: HR in the 120s. RRR. Per chart review, he was also tachycardic at his previous hospitalization. EKG in the ED with sinus tachycardia. Likely related to anxiety/respiratory distress. - Will add TSH on to morning labs. - Telemetry  Hx of multiple PEs: on chronic anticoagulation. - Continue Xarelto  Hypokalemia,  mild: K 3.4. - Will give IV K x 2 - Recheck BMP in the AM  Protein-Calorie Malnutrition: Pt cachetic with muscle wasting on exam. States he does not have a very good appetite. Seen by SLP last admission who recommended dysphagia III diet. - Per CCM, would keep him NPO for 24 hours - Start MIVFs x 24 hours - SLP eval  - May need elective PEG per CCM   FEN/GI: NPO x 24 hours Prophylaxis: on xarelto  Disposition: admit to FPTS  History of Present Illness:  George Evans is a 61 y.o. male presenting with shortness of breath.  Patient states since last hospitalization had had more difficulty handling his secretions. He was not able to get Trilogy vest at [facility] and was also having difficulty with the setting of BiPAP that was the back up option. Wife states that patient would take a few breaths and that BiPAP would cut off which was to be fixed today. Per patient, while at the SNF, he did not receive any Robitussin or Flonase and he did not receive his other medications on a regular schedule. This morning, patient states he felt a sensation of choking which let him know that he had a mucus plug stuck in his lungs that he attempted to cough up but was unsuccessful. He developed sudden onset shortness of breath due to the mucus plug. Suctioning at SNF was unsuccessful. He had progressive difficulty breathing and was brought to the ED.   In the ED, patient is requiring intermittent BiPAP to clear mucus plugging but periodically needing breaks d/t increased oral secretions from the BiPAP.   Review Of Systems: Per HPI with the following additions:  Review of Systems  Constitutional: Negative for chills and fever.  Respiratory: Positive for cough, sputum production and shortness of breath.   Cardiovascular: Negative for chest pain and palpitations.  Gastrointestinal: Positive for constipation. Negative for abdominal pain, diarrhea, nausea and vomiting.  Genitourinary: Negative for  dysuria, frequency and urgency.  Neurological: Negative for focal weakness and headaches.    Patient Active Problem List   Diagnosis Date Noted  . 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 MS (multiple sclerosis) (HCC)   . Aspiration into airway 11/27/2016  . Choking   . Muscular weakness   . Respiratory failure, chronic neuromuscular (HCC) 09/14/2016  . 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 Muscular dystrophies (HCC) 08/19/2016    Past Medical History: Past Medical History:  Diagnosis Date  . Abnormal posture   . Chronic viral hepatitis C (HCC)   . Constipation   . Difficulty walking   . Distal muscular dystrophy (HCC)   . 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 Major depressive disorder   . Muscle wasting   . Muscle weakness   . Other diseases of bronchus, not elsewhere classified (CODE)   . Peripheral vascular disease (HCC)   . Pressure ulcer of sacral region     Past Surgical History: Past Surgical History:  Procedure Laterality Date  . APPENDECTOMY  1992  . OTHER SURGICAL HISTORY  2016   Abcess removed from penis  . ROTATOR CUFF REPAIR Right 1989    Social History: Social History  Substance Use Topics  . Smoking status: Former Smoker    Packs/day: 2.00    Years: 40.00    Quit date: 06/14/2009  . Smokeless tobacco: Never Used  . Alcohol use No   Additional social history: Lives at [facility] Please also refer to relevant sections of EMR.  Family History: Family History  Problem Relation Age of Onset  . Diabetes Mother   . Cancer Sister        thyroid  . Cancer Sister        thyroid  1 uncle and 2 cousins with myotonic dystrophy   Allergies and Medications: Allergies  Allergen Reactions  . Coconut Oil Swelling   No current facility-administered medications on file prior to encounter.    Current Outpatient Prescriptions on File Prior to Encounter  Medication Sig Dispense Refill  .  cholecalciferol (VITAMIN D) 1000 units tablet Take 1,000 Units by mouth daily.    . fluticasone (FLONASE) 50 MCG/ACT nasal spray Place 2 sprays into both nostrils daily. 16 g 0  . glycopyrrolate (ROBINUL) 1 MG tablet Take 0.5 tablets (0.5 mg total) by mouth every 8 (eight) hours. 45 tablet 0  . guaifenesin (ROBITUSSIN) 100 MG/5ML syrup Take 200 mg by mouth every 6 (six) hours as needed for cough.    . ondansetron (ZOFRAN) 4 MG tablet Take 4 mg by mouth every 8 (eight) hours as needed for nausea or vomiting.    . rivaroxaban (XARELTO) 20 MG TABS tablet Take 20 mg by mouth daily.    Marland Kitchen senna (SENOKOT) 8.6 MG tablet Take 2 tablets by mouth at bedtime. Hold for loose stools      Objective: BP 116/74   Pulse (!) 114   Temp 97.6 F (36.4 C) (Oral)   Resp (!) 29   Ht 6' (1.829 m)   Wt 155 lb (70.3 kg)   SpO2 100%   BMI 21.02 kg/m  Exam: General: thin male, appears uncomfortable,  in NAD Eyes: PERRL, EOMI ENTM: MMM, thick oral secretions being suctioned, erythema of posterior oropharynx Neck: supple, no lymphadenopathy Cardiovascular: tachycardic, regular rhythm, no murmurs Respiratory: increased work of breathing with accessory muscle use, coarse breath sounds with poor air movement, occasional expiratory wheezing, tachypneic with RR in the mid 20s. Gastrointestinal: soft, nontender, nondistended MSK: decreased tone of LE. Moving UE equally Derm: no rashes or wounds Neuro: alert and oriented x3, very weak cough Psych: appropriate affect   Labs and Imaging: CBC BMET   Recent Labs Lab 23-Dec-2016 1715  WBC 8.6  HGB 16.6  HCT 50.6  PLT 298    Recent Labs Lab December 23, 2016 1715  NA 142  K 3.4*  CL 103  CO2 24  BUN 8  CREATININE 0.39*  GLUCOSE 91  CALCIUM 10.1     Arterial Blood Gas result:  pO2 92; pCO2 32.8; pH 7.408;  HCO3 20.7, %O2 Sat 97.  Trop POC 0.01  Dg Chest 1 View  Result Date: 12/23/2016 CLINICAL DATA:  Shortness of breath and cough EXAM: CHEST 1 VIEW COMPARISON:   Chest radiograph 11/27/2016 FINDINGS: Unchanged elevation of the left hemidiaphragm with associated left basilar atelectasis. Lungs are otherwise clear. Normal cardiomediastinal contours. IMPRESSION: Unchanged left hemidiaphragmatic elevation and associated atelectasis. Electronically Signed   By: Deatra Robinson M.D.   On: 23-Dec-2016 13:46    Leland Her, DO 12-23-16, 4:02 PM PGY-1, [facility] FPTS Intern pager: [phone], text pages welcome  FPTS Upper-Level Resident Addendum  I have independently interviewed and examined the patient. I have discussed the above with the original author and agree with their documentation. My edits for correction/addition/clarification are in blue. Please see also any attending notes.   Willadean Carol, MD PGY-2, [facility] FPTS Service pager: [phone] (text pages welcome through [employer]ION)

## 2016-12-06 NOTE — Progress Notes (Signed)
NIF -17 cmH2O X3 with good effort.

## 2016-12-07 ENCOUNTER — Inpatient Hospital Stay (HOSPITAL_COMMUNITY): Payer: Medicare Other

## 2016-12-07 DIAGNOSIS — Z515 Encounter for palliative care: Secondary | ICD-10-CM

## 2016-12-07 DIAGNOSIS — R1310 Dysphagia, unspecified: Secondary | ICD-10-CM

## 2016-12-07 DIAGNOSIS — E440 Moderate protein-calorie malnutrition: Secondary | ICD-10-CM

## 2016-12-07 DIAGNOSIS — R0602 Shortness of breath: Secondary | ICD-10-CM

## 2016-12-07 DIAGNOSIS — Z7189 Other specified counseling: Secondary | ICD-10-CM

## 2016-12-07 LAB — BASIC METABOLIC PANEL
Anion gap: 8 (ref 5–15)
BUN: 5 mg/dL — AB (ref 6–20)
CHLORIDE: 109 mmol/L (ref 101–111)
CO2: 28 mmol/L (ref 22–32)
Calcium: 9.6 mg/dL (ref 8.9–10.3)
Creatinine, Ser: <0.3 mg/dL — ABNORMAL LOW (ref 0.61–1.24)
GLUCOSE: 148 mg/dL — AB (ref 65–99)
POTASSIUM: 2.8 mmol/L — AB (ref 3.5–5.1)
Sodium: 145 mmol/L (ref 135–145)

## 2016-12-07 LAB — TSH: TSH: 0.491 u[IU]/mL (ref 0.350–4.500)

## 2016-12-07 MED ORDER — ENOXAPARIN SODIUM 80 MG/0.8ML ~~LOC~~ SOLN
1.0000 mg/kg | Freq: Two times a day (BID) | SUBCUTANEOUS | Status: DC
  Administered 2016-12-07 – 2016-12-08 (×3): 70 mg via SUBCUTANEOUS
  Filled 2016-12-07 (×3): qty 0.8

## 2016-12-07 MED ORDER — POTASSIUM CHLORIDE 20 MEQ/15ML (10%) PO SOLN
40.0000 meq | Freq: Three times a day (TID) | ORAL | Status: DC
  Filled 2016-12-07: qty 30

## 2016-12-07 MED ORDER — ENSURE ENLIVE PO LIQD: 237.0000 mL | Freq: Two times a day (BID) | ORAL | Status: DC

## 2016-12-07 NOTE — Progress Notes (Signed)
Family Medicine Teaching Service Daily Progress Note Intern Pager: [phone]  Patient name: George Evans Medical record number: [id number] Date of birth: 02/24/1956 Age: 61 y.o. Gender: male  Primary Care Provider: York Spaniel, MD Consultants: CCM Code Status: Full  Pt Overview and Major Events to Date:  6/25- admitted to FPTS with acute respiratory failure  Assessment and Plan: George Evans is a 61 y.o. male presenting with SOB . PMH is significant for limb-girdle muscular dystrophy, multiple PEs, and previously treated hepatitis C.   Acute respiratory failure 2/2 mucus plugging. Presents with IWOB and inability to handle secretions, intermittently requiring BiPAP in ED. Patient was not able to get Trilogy ventilator at SNF and nightly BiPAP has not been functioning appropriately. Follows with Pulmonologist Dr. Isaiah Evans. - CCM following, appreciate recommendations             - BiPAP nightly and prn during the day for IWOB (i.e. RR>35). Should not use BiPAP for >2 hours at a time.             - Continue robinul at decreased dose of 0.5mg  TID             - Chest PT via vest, suctioning, mucomyst nebs BID             - Continue home guaifenesin and flonase - Albuterol nebs bid - NIFs q12hrs - Monitor on telemetry - Continuous pulse ox - Palliative consult  Limb-girdle muscular dystrophy. Follows with Neurologist Dr. Anne Evans. Progressive weakness resulting in swallowing and breathing difficulties at this time. Last BM was Thursday, on senna at SNF, but has not been eating much d/t difficulty with swallowing - NPO for now, then resume Dysphagia 3 diet, thin liquids, meds crushed with puree per SLP recs last hospitalization - Senna-docusate qhs  Tachycardia: HR in the 120s. RRR. Per chart review, he was also tachycardic at his previous hospitalization. EKG in the ED with sinus tachycardia. Likely related to anxiety/respiratory distress. - TSH pending - Telemetry  Hx of multiple  PEs: on chronic anticoagulation. - Continue Xarelto  Hypokalemia, mild: K 3.4 on admission, patient could not tolerate IV K due to burning - Recheck BMP pending  Protein-Calorie Malnutrition: Pt cachetic with muscle wasting on exam. States he does not have a very good appetite. Seen by SLP last admission who recommended dysphagia III diet. - Per CCM, would keep him NPO for 24 hours - Start MIVFs x 24 hours - SLP eval  - May need elective PEG per CCM  FEN/GI: NPO x 24 hours Prophylaxis: on xarelto  Disposition: pending clinical improvement, back to ALF  Subjective:  Mouth very dry. Still has feeling of mucous plug that he cannot get up. Has pain in sacral area from sitting. No chest pain. SOB has improved some. No fevers or chills. Still poor appetite and is NPO.   Objective: Temp:  [97.5 F (36.4 C)-98.3 F (36.8 C)] 97.8 F (36.6 C) (06/26 0700) Pulse Rate:  [101-124] 101 (06/26 0700) Resp:  [15-185] 185 (06/26 0700) BP: (100-136)/(65-113) 107/78 (06/26 0700) SpO2:  [98 %-100 %] 100 % (06/26 0700) FiO2 (%):  [30 %] 30 % (06/26 0033) Weight:  [155 lb (70.3 kg)] 155 lb (70.3 kg) (06/25 1211) Physical Exam: General: thin chronically ill appearing man sitting up in bed, Willits in place, in NAD Cardiovascular: tachycardic, regular rhythm. No MRG Respiratory: coarse breath sounds bilaterally with decreased air movement Abdomen: soft, NTND Extremities: thin. Decreased tone in LE.   Laboratory:  Recent Labs Lab 01/01/2017 1715  WBC 8.6  HGB 16.6  HCT 50.6  PLT 298    Recent Labs Lab 01/01/17 1715 01/01/2017 1930  NA 142  --   K 3.4*  --   CL 103  --   CO2 24  --   BUN 8  --   CREATININE 0.39*  --   CALCIUM 10.1  --   PROT  --  7.7  BILITOT  --  1.2  ALKPHOS  --  68  ALT  --  23  AST  --  22  GLUCOSE 91  --     Imaging/Diagnostic Tests: Dg Chest 1 View  Result Date: 01/01/2017 CLINICAL DATA:  Shortness of breath and cough EXAM: CHEST 1 VIEW COMPARISON:  Chest  radiograph 11/27/2016 FINDINGS: Unchanged elevation of the left hemidiaphragm with associated left basilar atelectasis. Lungs are otherwise clear. Normal cardiomediastinal contours. IMPRESSION: Unchanged left hemidiaphragmatic elevation and associated atelectasis. Electronically Signed   By: Deatra Robinson M.D.   On: Jan 01, 2017 13:46    Tillman Sers, DO 12/07/2016, 8:07 AM PGY-1, [place] [facility] FPTS Intern pager: [phone], text pages welcome

## 2016-12-07 NOTE — Progress Notes (Signed)
Spoke with Dr. Wonda Olds and RN, and then again with pt and family to address their questions regarding types of liquids that can be safely consumed. The plan is to follow a free water protocol for now and discuss allowing other types of liquid after having another discussion with physicians regarding goals of care and risks associated with diet order. The pt is requesting coffee and chicken broth, stating that these are easier to consume than water because they are hot. I explained that water is the safest material to aspirate and that another conversation needs to happen re: goals, possible PEG, code status, etc. before changing diet order in chart (such as thin liquid diet).   George Ates, MA, CCC-SLP [phone]

## 2016-12-07 NOTE — Progress Notes (Signed)
Patient performed NIF -30 x3 with good effort.

## 2016-12-07 NOTE — Evaluation (Signed)
Clinical/Bedside Swallow Evaluation Patient Details  Name: George Evans MRN: [id number] Date of Birth: [birth year]  Today's Date: 12/07/2016 Time: SLP Start Time (ACUTE ONLY): 0845 SLP Stop Time (ACUTE ONLY): 0945 SLP Time Calculation (min) (ACUTE ONLY): 60 min  Past Medical History:  Past Medical History:  Diagnosis Date  . Abnormal posture   . Chronic viral hepatitis C (HCC)   . Constipation   . Difficulty walking   . Distal muscular dystrophy (HCC)   . 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 Major depressive disorder   . Muscle wasting   . Muscle weakness   . Other diseases of bronchus, not elsewhere classified (CODE)   . Peripheral vascular disease (HCC)   . Pressure ulcer of sacral region    Past Surgical History:  Past Surgical History:  Procedure Laterality Date  . APPENDECTOMY  1992  . OTHER SURGICAL HISTORY  2016   Abcess removed from penis  . ROTATOR CUFF REPAIR Right 1989   HPI:  Pt is a 61 y.o. male with PMH of limb-girdle muscular dystrophy, multiple PEs, and previously treated hepatitis C, presenting to ED on 6/25 with SOB and mucus plugging/ inability to handle secretions, intermittently requiring BiPAP in ED. Pt reportedly not eating much due to difficulty swallowing. Pt previously seen on recent admission after concern for pill aspiration (11/28/16). A MBS was completed March 2018 at outside facility- report unavailable but per SLP note 11/28/16 pt reported being told to have small bites/ sips and that there was a high aspiration risk although aspiration not observed during study. During previous evaluation recommendation was for a dysphagia 3 diet, thin liquids, meds crushed in puree along with compensatory strategies- multiple swallows, follow solids with liquid, effortful swallow. Bedside swallow eval ordered to re-evaluate.    Assessment / Plan / Recommendation Clinical Impression  Pt currently presenting with s/s concerning for aspiration including  multiple swallows across consistencies, weak throat clearing. The pt frequently "hawked up"/ suctioned out material he had attempted to swallow. Pt feels swallowing is currently worse compared to most recent hospital admission; pt feels this change is due to dryness/ excess mucus that he is having difficulty expectorating. Pt's swallow function did appear to improve as evaluation continued; pt requested hot coffee to assist with loosening mucus. Pt utilized chin tuck and effortful swallow independently which had previously been recommended. Pt appears at high risk of aspiration given acute-on-chronic/ progressive difficulties with swallowing and respiratory status. Pt previously had MBS at another facility in March 2018; recommend repeating MBS to objectively evaluate current swallow function/ make appropriate diet recommendations and strategies. Pt initially reluctant to this stating "it will be rough" but eventually agreed with further education and wife encouragement. Plan for MBS this afternoon. Until then, recommend pt remain NPO but allow small sips of thin liquid following oral care per pt request; educated pt on risks of aspiration and reviewed compensatory strategies. SLP Visit Diagnosis: Dysphagia, unspecified (R13.10)    Aspiration Risk  Severe aspiration risk    Diet Recommendation NPO;Free water protocol after oral care   Liquid Administration via: Straw Supervision: Staff to assist with self feeding;Full supervision/cueing for compensatory strategies Compensations: Slow rate;Small sips/bites;Multiple dry swallows after each bite/sip    Other  Recommendations Oral Care Recommendations: Oral care QID;Oral care prior to ice chip/H20 Other Recommendations: Have oral suction available   Follow up Recommendations Other (comment) (TBD)      Frequency and Duration  (TBD)  Prognosis Prognosis for Safe Diet Advancement: Fair Barriers to Reach Goals:  (progressive disease)       Swallow Study   General HPI: Pt is a 61 y.o. male with PMH of limb-girdle muscular dystrophy, multiple PEs, and previously treated hepatitis C, presenting to ED on 6/25 with SOB and mucus plugging/ inability to handle secretions, intermittently requiring BiPAP in ED. Pt reportedly not eating much due to difficulty swallowing. Pt previously seen on recent admission after concern for pill aspiration (11/28/16). A MBS was completed March 2018 at outside facility- report unavailable but per SLP note 11/28/16 pt reported being told to have small bites/ sips and that there was a high aspiration risk although aspiration not observed during study. During previous evaluation recommendation was for a dysphagia 3 diet, thin liquids, meds crushed in puree along with compensatory strategies- multiple swallows, follow solids with liquid, effortful swallow. Bedside swallow eval ordered to re-evaluate.  Type of Study: Bedside Swallow Evaluation Previous Swallow Assessment:  (See HPI) Diet Prior to this Study: NPO Temperature Spikes Noted: No Respiratory Status: Nasal cannula History of Recent Intubation: No Behavior/Cognition: Alert;Cooperative Oral Cavity Assessment: Dry Oral Cavity - Dentition: Edentulous Self-Feeding Abilities: Needs assist Patient Positioning: Upright in bed Baseline Vocal Quality: Low vocal intensity Volitional Cough: Weak Volitional Swallow: Able to elicit    Oral/Motor/Sensory Function Overall Oral Motor/Sensory Function: Generalized oral weakness Facial ROM: Within Functional Limits Facial Symmetry: Within Functional Limits Facial Strength: Reduced right;Reduced left   Ice Chips Ice chips: Not tested   Thin Liquid Thin Liquid: Impaired Presentation: Spoon;Straw Pharyngeal  Phase Impairments: Multiple swallows;Throat Clearing - Immediate    Nectar Thick Nectar Thick Liquid: Not tested   Honey Thick Honey Thick Liquid: Not tested   Puree Puree: Impaired Presentation:  Spoon Pharyngeal Phase Impairments: Multiple swallows;Throat Clearing - Immediate   Solid   GO   Solid: Impaired Oral Phase Impairments: Impaired mastication        Amy Cecille Aver, MA, CCC-SLP 12/07/2016,10:03 AM [phone]

## 2016-12-07 NOTE — Consult Note (Signed)
Consultation Note Date: 12/07/2016   Patient Name: George Evans  DOB: 03/23/1956  MRN: [id number]  Age / Sex: 61 y.o., male  PCP: Kathrynn Ducking, MD Referring Physician: Lind Covert, MD  Reason for Consultation: Establishing goals of care  HPI/Patient Profile: 61 y.o. male  with past medical history of limb-girdle muscular dystrophy, PVD, PE on xarelto, depression, viral hepatitis C, and dysphagia admitted on 11/23/2016 with shortness of breath. Recent hospitalization from 6/16-6/19 for acute respiratory failure due to poor secretion clearance. Discharged to SNF with orders for HS BiPAP but there was complications with BiPAP at the facility. In ED, patient placed on BiPAP. Patient with acute on chronic respiratory failure in the setting of progressive muscular dystrophy. PRN and HS BiPAP. Receiving chest physical therapy, nebs, flonase, robitussin, and robinul. Per PCCM, may need elective PEG placement. Palliative medicine consultation for goals of care.   Clinical Assessment and Goals of Care: I have reviewed medical records, discussed with care team, and met with patient, wife, and sister at bedside to discuss diagnosis, [place], EOL wishes, disposition and options. Patient working with respiratory therapy during majority of our conversation so I spoke with wife and sister.   Introduced Palliative Medicine as specialized medical care for people living with serious illness. It focuses on providing relief from the symptoms and stress of a serious illness. The goal is to improve quality of life for both the patient and the family.  We discussed a brief life review of the patient. 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 Diagnosed with muscular dystrophy in 2006. In 2013, he experienced a fall which led to his inability to ambulate and transition to a skilled nursing facilities. Since February, wife  speaks of his disease progression with decreased appetite due to dysphagia, 40 lb weight loss, and recent hospitalizations for acute respiratory failure.   Discussed hospital diagnoses, interventions, and underlying progression of LGMD. Patient, wife, and sister have a good understanding of progressing disease with the likelihood of this continuing to be a cycle.   His pulmonologist told them in February he would like need a trach and a peg in the "near future." Explored their thoughts on these options. Wife states "he has been very resistant to these." He has told her many times "I am tired" and "I know where I'm going" (heaven). Wife, [name], states "he is of sound mind and I respect his decisions."    Advanced directives and concepts specific to code status were discussed. He does not have a documented living will. [name] speaks of him wanting to "remain a full code for now" but also knows he would NOT want his life prolonged long term if it was not "quality." Educated on medical recommendation for DNR/DNI with underlying MD. Discussed risks of feeding tubes. Strongly encouraged she review Hard Choices copy I provided.    Wife speaks of wanting "quality" for him and seems realistic in the fact that trach/peg will prolong life but not give him a better quality of  life. "It will make him bed bound." Briefly discussed support from hospice services in the future if they do not pursue trach/peg placement--where focus would be comfort, quality, and preventing re-hospitalization.    At this point, wife waiting for speech to work with Mr. Furio this afternoon. Hopeful he will be able to eat today. Strongly encouraged she discuss big decisions they are faced with in the near future including code status, trach, peg.   Questions and concerns addressed. Therapeutic listening and emotional support provided.    SUMMARY OF RECOMMENDATIONS    FULL code. Educated on medical recommendation for DNR/DNI with  underlying LGMD. Hard Choices copy provided.   Discussed risks/benefits of trach/feeding tube. Strongly encouraged they discuss these decisions and consider completing advanced directives.   Speech therapy to work with patient today.   Palliative services to follow outpatient on discharge.   PMT will continue to support patient/family daily through hospitalization.   Code Status/Advance Care Planning:  Full code  Symptom Management:   Per attending  Palliative Prophylaxis:   Aspiration, Delirium Protocol, Oral Care and Turn Reposition  Additional Recommendations (Limitations, Scope, Preferences):  Full Scope Treatment  Psycho-social/Spiritual:   Desire for further Chaplaincy support: yes  Additional Recommendations: Caregiving  Support/Resources and Education on Hospice  Prognosis:   Unable to determine: guarded with acute respiratory failure, risk for aspiration and decompensation requiring intubation, decreased functional and nutritional status, and underlying progressive muscular dystrophy.   Discharge Planning: To Be Determined      Primary Diagnoses: Present on Admission: . Acute neuromuscular respiratory failure ([place])   I have reviewed the medical record, interviewed the patient and family, and examined the patient. The following aspects are pertinent.  Past Medical History:  Diagnosis Date  . Abnormal posture   . Chronic viral hepatitis C ([place])   . Constipation   . Difficulty walking   . 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 Limb-girdle muscular dystrophy ([employer]) 08/19/2016  . Major depressive disorder   . Muscle wasting   . Muscle weakness   . Other diseases of bronchus, not elsewhere classified (CODE)   . Peripheral vascular disease ([place])   . Pressure ulcer of sacral region    Social History   Social History  . Marital status: Married    Spouse name: N/A  . Number of children: 0  . Years of education: 10   Occupational History  . Retired      Social History Main Topics  . Smoking status: Former Smoker    Packs/day: 2.00    Years: 40.00    Quit date: 06/14/2009  . Smokeless tobacco: Never Used  . Alcohol use No  . Drug use: No  . Sexual activity: Not Asked   Other Topics Concern  . None   Social History Narrative   Lives at [facility]   Phone: [phone]   Caffeine use: Coffee/tea daily   Right-handed    Family History  Problem Relation Age of Onset  . Diabetes Mother   . Cancer Sister        thyroid  . Cancer Sister        thyroid   Scheduled Meds: . acetylcysteine  4 mL Nebulization BID  . albuterol  2.5 mg Nebulization BID AC & HS  . fluticasone  2 spray Each Nare Daily  . glycopyrrolate  0.5 mg Oral TID  . rivaroxaban  20 mg Oral Q supper  . senna-docusate  1 tablet Oral QHS   Continuous  Infusions: . dextrose 5 % and 0.9% NaCl 1,000 mL infusion 110 mL/hr at 12/07/16 0926   PRN Meds:.guaiFENesin Medications Prior to Admission:  Prior to Admission medications   Medication Sig Start Date End Date Taking? Authorizing Provider  cholecalciferol (VITAMIN D) 1000 units tablet Take 1,000 Units by mouth daily.   Yes [provider]  fluticasone (FLONASE) 50 MCG/ACT nasal spray Place 2 sprays into both nostrils daily. 12/01/16  Yes Verner Mould, MD  glycopyrrolate (ROBINUL) 1 MG tablet Take 0.5 tablets (0.5 mg total) by mouth every 8 (eight) hours. 11/30/16  Yes Verner Mould, MD  guaiFENesin-codeine [facility]) 100-10 MG/5ML syrup Take 10 mLs by mouth every 6 (six) hours.   Yes [provider]  ondansetron (ZOFRAN) 4 MG tablet Take 4 mg by mouth every 8 (eight) hours as needed for nausea or vomiting.   Yes [provider]  rivaroxaban (XARELTO) 20 MG TABS tablet Take 20 mg by mouth daily.   Yes [provider]  senna (SENOKOT) 8.6 MG tablet Take 2 tablets by mouth at bedtime. Hold for loose stools   Yes [provider]    Allergies  Allergen Reactions  . Coconut Oil Swelling   Review of Systems  Constitutional: Positive for activity change and appetite change.  HENT: Positive for trouble swallowing.   Respiratory: Positive for choking and shortness of breath.   Neurological: Positive for weakness.   Physical Exam  Constitutional: He is oriented to person, place, and time. He is cooperative. He appears ill.  HENT:  Head: Normocephalic and atraumatic.  Cardiovascular: Regular rhythm.   Pulmonary/Chest: No accessory muscle usage. No tachypnea. No respiratory distress. He has decreased breath sounds.  Abdominal: Normal appearance.  Neurological: He is alert and oriented to person, place, and time.  Skin: Skin is warm and dry.  Psychiatric: Cognition and memory are normal.  Nursing note and vitals reviewed.  Vital Signs: BP 107/78 (BP Location: Right Arm)   Pulse (!) 101   Temp 97.8 F (36.6 C) (Oral)   Resp (!) 185   Ht 6' (1.829 m)   Wt 70.3 kg (155 lb)   SpO2 99%   BMI 21.02 kg/m  Pain Assessment: No/denies pain   Pain Score: 0-No pain  SpO2: SpO2: 99 % O2 Device:SpO2: 99 % O2 Flow Rate: .O2 Flow Rate (L/min): 2 L/min  IO: Intake/output summary:   Intake/Output Summary (Last 24 hours) at 12/07/16 1013 Last data filed at 12/07/16 0900  Gross per 24 hour  Intake          1070.67 ml  Output              100 ml  Net           970.67 ml    LBM:   Baseline Weight: Weight: 70.3 kg (155 lb) Most recent weight: Weight: 70.3 kg (155 lb)     Palliative Assessment/Data: PPS 30%   Flowsheet Rows     Most Recent Value  Intake Tab  Referral Department  Hospitalist  Unit at Time of Referral  Intermediate Care Unit  Palliative Care Primary Diagnosis  Pulmonary  Date Notified  11/12/2016  Palliative Care Type  New Palliative care  Reason for referral  Clarify Goals of Care  Date of Admission  11/13/2016  Date first seen by Palliative Care  12/07/16  # of days Palliative referral  response time  1 Day(s)  # of days IP prior to Palliative referral  0  Clinical Assessment  Palliative Performance Scale Score  30%  Psychosocial & Spiritual Assessment  Palliative Care Outcomes  Patient/Family meeting held?  Yes  Who was at the meeting?  patient, wife, sister  Palliative Care Outcomes  Clarified goals of care, Counseled regarding hospice, ACP counseling assistance, Provided psychosocial or spiritual support      Time In: 0915 Time Out: 1025 Time Total: 55mn Greater than 50%  of this time was spent counseling and coordinating care related to the above assessment and plan.  Signed by:  MIhor Dow FNP-C Palliative Medicine Team  Phone: [phone]: [phone]  Please contact Palliative Medicine Team phone at [phone] questions and concerns.  For individual provider: See AShea Evans

## 2016-12-07 NOTE — Progress Notes (Signed)
Name: George Evans MRN: [id number] DOB: 08-26-1955    ADMISSION DATE:  12/03/2016 CONSULTATION DATE:  6/25   REFERRING MD :  Dr. 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 His muscular dystrophy has been starting to affect his respiratory muscles. He was recently admitted to [facility] 6/16 > 6/19 after he was thought to have aspirated a pill. This was not found to be the case on CXR or bronchoscopy. Course complicated by acute on chronic respiratory failure secondary to excessive secretions and progression of MD. He was started on Trilogy Vent, Chest PT, and suctioning with improvement. Unable to be discharged on Trilogy so he was recommended nocturnal BiPAP, which he tolerated well and was discharged to [facility].  Since discharge he has been unable to use BiPAP due to some issue at facility. Initially he was doing OK, however, 6/25 he developed acute onset dyspnea for which he presented to [facility] Emergency department. He complains of sputum he is unable to clear sitting in his chest. He does not feel as though this is food/aspiration related. In the emergency department he was started on BiPAP, which did successfully ease his work of breathing. He was [facility]. PCCM was asked to see for further evaluation.  SIGNIFICANT EVENTS  6/16 > 6/19 admit for respiratory failure and ? Pill aspiration. 6/25 > admit with dyspnea  STUDIES:  Admit CXR > L hemidiaphragmatic elevation and associated ATX  SUBJECTIVE:  Doing better today. Wore BiPAP overnight for only 2 hours, hasn't been back on today. Breathing comfortably. Failed SLP eval.  VITAL SIGNS: Temp:  [97.5 F (36.4 C)-98.3 F (36.8 C)] 97.8 F (36.6 C) (06/26 0700) Pulse Rate:  [101-124] 101 (06/26 0700) Resp:   [15-185] 185 (06/26 0700) BP: (100-136)/(65-113) 107/78 (06/26 0700) SpO2:  [98 %-100 %] 100 % (06/26 0700) FiO2 (%):  [30 %] 30 % (06/26 0033) Weight:  [70.3 kg (155 lb)] 70.3 kg (155 lb) (06/25 1211)  PHYSICAL EXAMINATION: General:  Frail adult male in NAD Neuro:  Alert, oriented, non-focal HEENT:  [place]/AT, PERRL, no JVD Cardiovascular:  RRR, no MRG Lungs:  Some rhonchi, improved. No distress Abdomen:  Soft, non-tender, non-distended Musculoskeletal:  No acute deformity Skin:  Grossly intact   Recent Labs Lab 11/16/2016 1715 12/07/16 0820  NA 142 145  K 3.4* 2.8*  CL 103 109  CO2 24 28  BUN 8 5*  CREATININE 0.39* <0.30*  GLUCOSE 91 148*    Recent Labs Lab 11/18/2016 1715  HGB 16.6  HCT 50.6  WBC 8.6  PLT 298   Dg Chest 1 View  Result Date: 11/17/2016 CLINICAL DATA:  Shortness of breath and cough EXAM: CHEST 1 VIEW COMPARISON:  Chest radiograph 11/27/2016 FINDINGS: Unchanged elevation of the left hemidiaphragm with associated left basilar atelectasis. Lungs are otherwise clear. Normal cardiomediastinal contours. IMPRESSION: Unchanged left hemidiaphragmatic elevation and associated atelectasis. Electronically Signed   By: Deatra Robinson M.D.   On: 11/26/2016 13:46    ASSESSMENT / PLAN:  Acute on chronic respiratory failure: in the setting of progressive muscular dystrophy complicated by impaired clearance of respiratory secretions.  - PRN BiPAP during waking hours only for moderate severe distress - QHS BiPAP mandatory (he only wore 2 hours last night) - Chest vest - Mucomyst/albuterol nebs -  Robitussin - Rubinol DC - he feels as though this is making secretions harder to clear. - Home Flonase continue - Incentive spirometry and flutter valve if he is able.   Malnutrition: He is already very behind in this regard and a decision will need to be made in the next 24> 48 hours how we will provide him nutrition - SLP recommending MBS - Add Ensure supplement - Family  deciding about PEG, he should probably have this done prior to discharge.   Joneen Roach, AGACNP-BC [facility] Pulmonology/Critical Care Pager [phone] or [phone]  12/07/2016 9:47 AM

## 2016-12-07 NOTE — Progress Notes (Signed)
RT note: Patient with good effort had a NIF of -40 .

## 2016-12-07 NOTE — Telephone Encounter (Signed)
He was seen by the consult service. Thanks  PM

## 2016-12-07 NOTE — Progress Notes (Signed)
ANTICOAGULATION CONSULT NOTE  Pharmacy Consult for Lovenox Indication: history of multiple pulmonary emboli   Assessment: 89 yom with history of multiples PEs on Xarelto PTA. No active PE. Pharmacy consulted to transition to Lovenox, as patient cannot swallow currently and is NPO. CBC, renal function stable. Note - appears patient was given Xarelto twice yesterday, once at SNF and again at [facility] on transfer - RN monitoring and no s/sx bleeding reported. Last dose of Xarelto given at 2201 on 6/25.  Goal of Therapy:  Anti-Xa level 0.6-1 units/ml 4hrs after LMWH dose given Monitor platelets by anticoagulation protocol: Yes   Plan:  D/c Xarelto Start Lovenox 70mg  (~1mg /kg) q12h when next Xarelto dose scheduled at 2200 Monitor CBC at least q72h, s/sx bleeding F/u ability to transition back to Xarelto as appropriate  Babs Bertin, PharmD, BCPS Clinical Pharmacist 12/07/2016 11:58 AM

## 2016-12-07 NOTE — Discharge Summary (Signed)
Family Medicine Teaching [facility] Interim Summary  Patient name: George Evans Medical record number: [id number] Date of birth: 15-Aug-1955 Age: 61 y.o. Gender: male Date of Admission: 2016/12/29  Date of Interim Hospital Summary: 12/14/2016 Admitting Physician: Carney Living, MD  Primary Care Provider: York Spaniel, MD Consultants: CCM, SLP  Indication for Hospitalization: acute hypoxic respiratory failure  Discharge Diagnoses/Problem List:  Patient Active Problem List   Diagnosis Date Noted  . History of ETT   . Acute respiratory distress   . At high risk for aspiration   . Hypokalemia   . Moderate protein-calorie malnutrition (HCC)   . Dysphagia   . Palliative care by specialist   . Goals of care, counseling/discussion   . SOB (shortness of breath)   . Acute neuromuscular respiratory failure (HCC) December 29, 2016  . 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 MS (multiple sclerosis) (HCC)   . Aspiration into airway 11/27/2016  . Choking   . Muscular weakness   . Respiratory failure, chronic neuromuscular (HCC) 09/14/2016  . 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 He was recently discharged by our service with recommendations to use BiPAP at night. However at his facility this machine was not working so patient has not received any respiratory support at night. He was admitted to [facility] for management. CCM was consulted and assisted with care. Patient was continued on BiPAP at night with improvement. He had poor appetite and further difficulty swallowing. SLP was consulted. Patient had a modified barium swallow on 6/25  that showed poor swallowing function. SLP recommended keeping patient NPO. Patient continued to use thin liquids for comfort and taste. He decided to proceed with PEG and this was placed on 6/28.  He began receiving nutrition through his PEG tube on 7/1 and has tolerated this well.  He received a tracheostomy tube on 6/30.  On 7/2, CXR showed a LLL infiltrate, and he was started on 7 days of IV ceftriaxone.  He was transferred to the family medicine teaching service on 7/3.  He had episodes of tachycardia into the 130s and hypotension, which were relieved by fluid bolus.  His CXR showed improvement but persistence of LLL opacity/atalectasis on CXR's on 7/3, 7/4, and 7/5.    Significant Procedures: PEG 6/28, Tracheostomy 6/30  Significant Labs and Imaging:   Recent Labs Lab 12/15/16 0220 12/16/16 0354 12/17/16 0303  WBC 6.3 5.3 6.3  HGB 12.8* 12.8* 12.3*  HCT 39.0 41.2 37.5*  PLT 276 293 269    Recent Labs Lab 12/13/16 0349 12/14/16 0147 12/15/16 0220 12/16/16 0947 12/17/16 0737  NA 139 136 139 141 135  K 2.7* 3.6 3.2* 3.7 3.6  CL 106 104 104 104 105  CO2 28 25 27 29 23   GLUCOSE 104* 101* 111* 125* 98  BUN <5* <5* <5* 5* 9  CREATININE <0.30* <0.30* <0.30* <0.30* <0.30*  CALCIUM 8.9 9.1 9.6 9.9 9.5   Dg Chest 1 View Result Date: Dec 29, 2016 IMPRESSION: Unchanged left hemidiaphragmatic elevation and associated atelectasis. Electronically Signed   By: Deatra Robinson M.D.   On: 12/29/16 13:46   Dg Swallowing Func-speech Pathology Result Date: 12/07/2016 CHL IP CLINICAL  IMPRESSIONS 12/07/2016 Clinical Impression Pt currently with a severe pharyngeal phase/ cervical esophageal phase, motor-based dysphagia. Pharyngeal phase characterized by significantly reduced epiglottic inversion/ pharyngeal peristalsis and reduced hyolaryngeal excursion resulting in reduced airway closure and aspiration of thin liquids, both during the swallow and then after the swallow due to residuals. Pt with very  weak cough and unable to clear aspirated material. Very small amounts (25% at most) of the bolus actually appeared to pass through the UES. At the cervical esophageal level, there is an appearance of a prominent UES and reduced UES opening, also impacting swallow function, along with poor head/ trunk control which at times appeared to push bolus forward toward airway. Significant residuals in the pharyngeal spaces post-swallow were minimally cleared on subsequent swallows. Numerous strategies attempted; the Mendelsohn maneuver was most effective in clearing bolus past the UES; however pt continued to have significant pharyngeal residuals. Given these findings, pt is at a severe risk of aspiration and inadequate nutrition. Pt reported having discussion with MD regarding possible PEG placement and that this is under consideration. Will defer diet decision to MD; safest diet if pursued would be thin liquid only with strategies in place but pt would still be at severe risk of aspiration. Pt would be a great candidate for a free water protocol following thorough oral care for comfort. Pt may also have some short-term benefit from pharyngeal swallow exercises although prognosis for significant improvement is guarded. Will continue to follow for education/ review of compensatory strategies/ exercises if a diet or free water protocol is desired.  SLP Visit Diagnosis Dysphagia, pharyngoesophageal phase (R13.14) Attention and concentration deficit following -- Frontal lobe and executive function deficit following -- Impact on safety and function Severe aspiration risk;Risk for inadequate nutrition/hydration   CHL IP TREATMENT RECOMMENDATION 12/07/2016 Treatment Recommendations Therapy as outlined in treatment plan below   Prognosis 12/07/2016 Prognosis for Safe Diet Advancement Guarded Barriers to Reach Goals Severity of deficits Barriers/Prognosis Comment -- CHL IP DIET RECOMMENDATION 12/07/2016 SLP Diet Recommendations  NPO;Alternative means - long-term;Free water protocol after oral care Liquid Administration via Straw Medication Administration Via alternative means Compensations Slow rate;Small sips/bites;Multiple dry swallows after each bite/sip;Other (Comment)- Mendelsohn maneuver Postural Changes Remain semi-upright after after feeds/meals (Comment);Seated upright at 90 degrees   CHL IP OTHER RECOMMENDATIONS 12/07/2016 Recommended Consults -- Oral Care Recommendations Oral care QID Other Recommendations Have oral suction available    Dg Chest Port 1 View  Result Date: 12/16/2016 CLINICAL DATA:  Acute respiratory failure EXAM: PORTABLE CHEST 1 VIEW COMPARISON:  12/15/2016 and prior exams FINDINGS: A tracheostomy tube and left lower lung consolidation/atelectasis again noted. Cardiomediastinal silhouette is unchanged. There is no evidence of pneumothorax or definite pleural effusion. No acute bony abnormalities are noted. IMPRESSION: Unchanged appearance of the chest with left lower lung consolidation/atelectasis again noted. Electronically Signed   By: Harmon Pier M.D.   On: 12/16/2016 20:37   Dg Chest Port 1 View  Result Date: 12/15/2016 CLINICAL DATA:  Vent dependent EXAM: PORTABLE CHEST 1 VIEW COMPARISON:  12/14/2016 FINDINGS: Tracheostomy tube is unchanged. Left lower lobe atelectasis or infiltrate again noted, unchanged. Heart is normal size. Right lung is clear. IMPRESSION: Left lower lobe atelectasis or infiltrate, stable. Electronically Signed   By: Charlett Nose M.D.   On: 12/15/2016 07:22   Dg Chest Port 1 View  Result Date: 12/14/2016 CLINICAL DATA:  History of pneumonia, evaluate tracheostomy position EXAM: PORTABLE CHEST 1 VIEW COMPARISON:  Portable chest x-ray of 12/13/2016 FINDINGS: There is persistent  haziness at the left lung base suspicious for atelectasis/ pneumonia and possibly a small left pleural effusion. Minimal right basilar atelectasis remains. Mediastinal and hilar contours are unremarkable.  Heart size is stable. Tracheostomy appears unchanged in position. No bony abnormality is seen. IMPRESSION: 1. Persistent haziness at the left lung base most consistent with atelectasis, pneumonia, and possibly a small left pleural effusion. 2. No apparent change in position of tracheostomy. Electronically Signed   By: Dwyane Dee M.D.   On: 12/14/2016 09:16     Results/Tests Pending at Time of Discharge: None  Discharge Medications:  Allergies as of 12/17/2016      Reactions   Coconut Oil Swelling      Medication List    STOP taking these medications   glycopyrrolate 1 MG tablet Commonly known as:  ROBINUL     TAKE these medications   cefTRIAXone 1 g injection Commonly known as:  ROCEPHIN Inject 1 g into the muscle once.   cholecalciferol 1000 units tablet Commonly known as:  VITAMIN D Take 1,000 Units by mouth daily.   feeding supplement (OSMOLITE 1.2 CAL) Liqd Place 1,000 mLs into feeding tube daily.   fluticasone 50 MCG/ACT nasal spray Commonly known as:  FLONASE Place 2 sprays into both nostrils daily.   guaiFENesin-codeine 100-10 MG/5ML syrup Commonly known as:  ROBITUSSIN AC Take 10 mLs by mouth every 6 (six) hours.   ondansetron 4 MG tablet Commonly known as:  ZOFRAN Take 4 mg by mouth every 8 (eight) hours as needed for nausea or vomiting.   rivaroxaban 20 MG Tabs tablet Commonly known as:  XARELTO Take 20 mg by mouth daily.   senna 8.6 MG tablet Commonly known as:  SENOKOT Take 2 tablets by mouth at bedtime. Hold for loose stools       Discharge Planning: Patient was to be discharged to SNF but on day of discharge planning he decided to embrace comfort care instead. He stayed in hospital and was seen by Palliative Care team where goals of care were discussed and finalized. He was not discharged on this date.    Lennox Solders, MD PGY-1, [facility] Family [facility]

## 2016-12-07 NOTE — Progress Notes (Signed)
Modified Barium Swallow Progress Note  Patient Details  Name: George Evans MRN: [id number] Date of Birth: Oct 29, 1955  Today's Date: 12/07/2016  Modified Barium Swallow completed.  Full report located under Chart Review in the Imaging Section.  Brief recommendations include the following:  Clinical Impression  Pt currently with a severe pharyngeal phase/ cervical esophageal phase, motor-based dysphagia. Pharyngeal phase characterized by significantly reduced epiglottic inversion/ pharyngeal peristalsis and reduced hyolaryngeal excursion resulting in reduced airway closure and aspiration of thin liquids, both during the swallow and then after the swallow due to residuals in the vallecular space, pyriforms, and upper esophageal sphincter. Pt with very weak cough and unable to clear aspirated material. Very small amounts (25% at most) of the bolus actually appeared to pass through the UES. At the cervical esophageal level, there is an appearance of a prominent UES and reduced UES opening, also impacting swallow function. Pt with significant residuals in the pharyngeal spaces post-swallow which were minimally cleared. Numerous strategies attempted; the Mendelsohn maneuver was most effective in clearing bolus past the UES; however pt continued to have significant residuals post-swallow. Given these findings, pt is at a severe risk of aspiration and inadequate nutrition. Pt reported having discussion with MD regarding possible PEG placement and that this is under consideration. Will defer diet decision to MD; safest diet would be thin liquid with strategies in place but pt would still be at high risk of aspiration. Pt would be a great candidate for a free water protocol following thorough oral care for comfort. Pt may also have some short-term benefit from pharyngeal swallow exercises although prognosis for significant improvement is guarded. Will continue to follow for education/ review of compensatory  strategies if a diet or free water protocol is desired.    Swallow Evaluation Recommendations       SLP Diet Recommendations: NPO;Alternative means - long-term;Free water protocol after oral care. *If PO diet is desired, thin liquid diet would be safest but still high risk of aspiration.   Liquid Administration via: Straw   Medication Administration: Via alternative means   Supervision: Staff to assist with self feeding;Full supervision/cueing for compensatory strategies;Comment (for water protocol)   Compensations: Slow rate;Small sips/bites;Multiple dry swallows after each bite/sip;Other (Comment) (Mendelsohn maneuver)   Postural Changes: Remain semi-upright after after feeds/meals (Comment);Seated upright at 90 degrees   Oral Care Recommendations: Oral care QID   Other Recommendations: Have oral suction available    Amy Cecille Aver, MA, CCC-SLP 12/07/2016,2:40 PM  [phone]

## 2016-12-07 NOTE — Care Management Note (Signed)
Case Management Note  Patient Details  Name: Shamaar Kempson MRN: [id number] Date of Birth: [birth year]  Subjective/Objective:         Pt admitted with increasing inability to handle secretions - from [facility]           Action/Plan:  PTA from [facility] - per CCM pt will need either triology  Vent (preferred) or Non invasive ventilation greater than BIPAP at night at facility.  CSW consulted and informed of PCCM request - CSW actively seeking placement of facility that can provide this equipment.     Expected Discharge Date:                  Expected Discharge Plan:  Skilled Nursing Facility (From [facility])  In-House Referral:  Clinical Social Work  Discharge planning Services  CM Consult  Post Acute Care Choice:    Choice offered to:     DME Arranged:    DME Agency:     HH Arranged:    HH Agency:     Status of Service:     If discussed at [employer], dates discussed:    Additional Comments:  Cherylann Parr, RN 12/07/2016, 1:55 PM

## 2016-12-07 NOTE — Progress Notes (Signed)
Late entry from around 1600. Discussed SLP recommendations with patient and his family. He would like to have a liquid diet because he enjoys coffee and broth. He understands possible risk of aspiration but says having some form of a diet is important for his quality of life. Placed full liquid diet order.  Dani Gobble, MD [facility], PGY-2

## 2016-12-08 ENCOUNTER — Inpatient Hospital Stay (HOSPITAL_COMMUNITY): Payer: Medicare Other

## 2016-12-08 DIAGNOSIS — E876 Hypokalemia: Secondary | ICD-10-CM

## 2016-12-08 DIAGNOSIS — Z9189 Other specified personal risk factors, not elsewhere classified: Secondary | ICD-10-CM

## 2016-12-08 LAB — BASIC METABOLIC PANEL
ANION GAP: 5 (ref 5–15)
ANION GAP: 4 — AB (ref 5–15)
Anion gap: 7 (ref 5–15)
CALCIUM: 9.2 mg/dL (ref 8.9–10.3)
CALCIUM: 9.1 mg/dL (ref 8.9–10.3)
CO2: 28 mmol/L (ref 22–32)
CO2: 27 mmol/L (ref 22–32)
CO2: 29 mmol/L (ref 22–32)
Calcium: 8.9 mg/dL (ref 8.9–10.3)
Chloride: 111 mmol/L (ref 101–111)
Chloride: 110 mmol/L (ref 101–111)
Chloride: 110 mmol/L (ref 101–111)
Creatinine, Ser: <0.3 mg/dL — ABNORMAL LOW (ref 0.61–1.24)
Creatinine, Ser: <0.3 mg/dL — ABNORMAL LOW (ref 0.61–1.24)
GLUCOSE: 138 mg/dL — AB (ref 65–99)
GLUCOSE: 87 mg/dL (ref 65–99)
Glucose, Bld: 92 mg/dL (ref 65–99)
POTASSIUM: 3.6 mmol/L (ref 3.5–5.1)
Potassium: <2 mmol/L — CL (ref 3.5–5.1)
Potassium: 2.4 mmol/L — CL (ref 3.5–5.1)
SODIUM: 143 mmol/L (ref 135–145)
Sodium: 144 mmol/L (ref 135–145)
Sodium: 144 mmol/L (ref 135–145)

## 2016-12-08 LAB — MAGNESIUM: MAGNESIUM: 1.5 mg/dL — AB (ref 1.7–2.4)

## 2016-12-08 MED ORDER — SODIUM CHLORIDE 0.9 % IV SOLN
INTRAVENOUS | Status: DC
  Administered 2016-12-08 – 2016-12-10 (×7): via INTRAVENOUS
  Filled 2016-12-08 (×10): qty 1000

## 2016-12-08 MED ORDER — MAGNESIUM SULFATE 2 GM/50ML IV SOLN
2.0000 g | Freq: Once | INTRAVENOUS | Status: AC
  Administered 2016-12-08: 2 g via INTRAVENOUS
  Filled 2016-12-08: qty 50

## 2016-12-08 NOTE — Progress Notes (Signed)
Pt refuses CPT at this time stating he is still sore.  Order changing to BID and PRN per Dr. Vassie Loll.

## 2016-12-08 NOTE — Progress Notes (Signed)
Talked with wife and patient regarding procedure tomorrow. They state that they have more questions and need to speak to the doctor and meghan about it. They have not consented to this procedure as of yet, until they speak to the MD. As far as trach goes, they stated that they were told that he would have to go out of state if he were trached due to [place], so this is out of the question.

## 2016-12-08 NOTE — Progress Notes (Signed)
Pt refuses CPT this morning saying he is sore from previous times.

## 2016-12-08 NOTE — Progress Notes (Signed)
Family Medicine Teaching Service Daily Progress Note Intern Pager: [phone]  Patient name: George Evans Medical record number: [id number] Date of birth: 09-03-1955 Age: 61 y.o. Gender: male  Primary Care Provider: York Spaniel, MD Consultants: CCM Code Status: Full  Pt Overview and Major Events to Date:  6/25- admitted to FPTS with acute respiratory failure  Assessment and Plan: George Evans is a 61 y.o. male presenting with SOB . PMH is significant for limb-girdle muscular dystrophy, multiple PEs, and previously treated hepatitis C.   Acute respiratory failure 2/2 mucus plugging- improved. Patient was not able to get Trilogy ventilator at SNF and nightly BiPAP has not been functioning appropriately. Follows with Pulmonologist Dr. Isaiah Serge. - CCM following, appreciate recommendations             - BiPAP nightly and prn during the day for IWOB (i.e. RR>35). Should not use BiPAP for >2 hours at a time.             - Continue robinul at decreased dose of 0.5mg  TID             - Chest PT via vest, suctioning, mucomyst nebs BID             - Continue home guaifenesin and flonase - Albuterol nebs bid - NIFs q12hrs - Monitor on telemetry - Continuous pulse ox - Palliative consult  Hypokalemia: K 3.4 on admission, patient could not tolerate IV K due to burning. Repeat K <2.0. Patient could not tolerate oral K solution due to dysphagia. Currently receiving KCl in fluids at 500cc/hr -receheck BMP at 9 am -may have to change IV site to give IV K -repleted mag  -monitor closely  Limb-girdle muscular dystrophy. Follows with Neurologist Dr. Anne Hahn. Progressive weakness resulting in swallowing and breathing difficulties at this time. Last BM was Thursday, on senna at SNF, but has not been eating much d/t difficulty with swallowing - NPO for now, then resume Dysphagia 3 diet, thin liquids, meds crushed with puree per SLP recs last hospitalization - Senna-docusate qhs  Protein-Calorie  Malnutrition: Had modified barium swallow 6/26 demonstrating aspiration, recommended NPO and sips of water for dry mouth. Patient asked for clear liquid diet and acknowledged risk of aspiration. Patient interested in pursuing PEG this admission. - SLP following, appreciate recommendations - elective PEG per CCM  Tachycardia: HR in the 120s. RRR. Per chart review, he was also tachycardic at his previous hospitalization. EKG in the ED with sinus tachycardia. Likely related to anxiety/respiratory distress. TSH normal - monitor on Telemetry  Hx of multiple PEs: on chronic anticoagulation. - Xarelto held, lovenox per pharmacy  FEN/GI: thin liquids, take small sips through straw. Patient aware of risk of aspiration. Diet per his request. Prophylaxis: hold xarelto while NPO, lovenox per pharm  Disposition: pending clinical improvement, back to ALF  Subjective:  Doing ok, sister at bedside. Taking small sips of liquids, coffee water etc, for comfort and to keep mouth moist. Reports he is doing well with this. Chest hurts from chest PT. Interested in pursuing peg at this time to gain strength, improve nutrition. No other concerns at this time.   Objective: Temp:  [97.3 F (36.3 C)-97.9 F (36.6 C)] 97.3 F (36.3 C) (06/27 0315) Pulse Rate:  [63-101] 93 (06/27 0400) Resp:  [14-21] 14 (06/27 0400) BP: (103-114)/(58-83) 103/58 (06/27 0400) SpO2:  [94 %-100 %] 99 % (06/27 0734) Physical Exam: General: thin chronically ill appearing man sitting up in bed,  in place, in NAD  Cardiovascular: tachycardic, regular rhythm. No MRG Respiratory: coarse breath sounds bilaterally with decreased air movement Abdomen: soft, NTND Extremities: thin. Decreased tone in LE.   Laboratory:  Recent Labs Lab 12/30/16 1715  WBC 8.6  HGB 16.6  HCT 50.6  PLT 298    Recent Labs Lab 12-30-2016 1715 Dec 30, 2016 1930 12/07/16 0820 12/08/16 0255  NA 142  --  145 144  K 3.4*  --  2.8* <2.0*  CL 103  --  109  111  CO2 24  --  28 28  BUN 8  --  5* <5*  CREATININE 0.39*  --  <0.30* <0.30*  CALCIUM 10.1  --  9.6 9.2  PROT  --  7.7  --   --   BILITOT  --  1.2  --   --   ALKPHOS  --  68  --   --   ALT  --  23  --   --   AST  --  22  --   --   GLUCOSE 91  --  148* 138*    Imaging/Diagnostic Tests: Dg Swallowing Func-speech Pathology  Result Date: 12/07/2016 Objective Swallowing Evaluation: Type of Study: MBS-Modified Barium Swallow Study Patient Details Name: George Evans MRN: [id number] Date of Birth: 07/03/1955 Today's Date: 12/07/2016 Time: SLP Start Time (ACUTE ONLY): 1340-SLP Stop Time (ACUTE ONLY): 1420 SLP Time Calculation (min) (ACUTE ONLY): 40 min Past Medical History: Past Medical History: Diagnosis Date . Abnormal posture  . Chronic viral hepatitis C (HCC)  . Constipation  . Difficulty walking  . Distal muscular dystrophy (HCC)  . 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 Major depressive disorder  . Muscle wasting  . Muscle weakness  . Other diseases of bronchus, not elsewhere classified (CODE)  . Peripheral vascular disease (HCC)  . Pressure ulcer of sacral region  Past Surgical History: Past Surgical History: Procedure Laterality Date . APPENDECTOMY  1992 . OTHER SURGICAL HISTORY  2016  Abcess removed from penis . ROTATOR CUFF REPAIR Right 1989 HPI: Pt is a 61 y.o. male with PMH of limb-girdle muscular dystrophy, multiple PEs, and previously treated hepatitis C, presenting to ED on 6/25 with SOB and mucus plugging/ inability to handle secretions, intermittently requiring BiPAP in ED. Pt reportedly not eating much due to difficulty swallowing. Pt previously seen on recent admission after concern for pill aspiration (11/28/16). A MBS was completed March 2018 at outside facility- report unavailable but per SLP note 11/28/16 pt reported being told to have small bites/ sips and that there was a high aspiration risk although aspiration not observed during study. During previous evaluation recommendation  was for a dysphagia 3 diet, thin liquids, meds crushed in puree along with compensatory strategies- multiple swallows, follow solids with liquid, effortful swallow. Bedside swallow eval ordered to re-evaluate.  Subjective: pt alert, says his swallowing feels unchanged since prior MBS, has particularly things he will or will not eat Assessment / Plan / Recommendation CHL IP CLINICAL IMPRESSIONS 12/07/2016 Clinical Impression Pt currently with a severe pharyngeal phase/ cervical esophageal phase, motor-based dysphagia. Pharyngeal phase characterized by significantly reduced epiglottic inversion/ pharyngeal peristalsis and reduced hyolaryngeal excursion resulting in reduced airway closure and aspiration of thin liquids, both during the swallow and then after the swallow due to residuals. Pt with very weak cough and unable to clear aspirated material. Very small amounts (25% at most) of the bolus actually appeared to pass through the UES. At the cervical esophageal level, there is an appearance of a prominent UES  and reduced UES opening, also impacting swallow function, along with poor head/ trunk control which at times appeared to push bolus forward toward airway. Significant residuals in the pharyngeal spaces post-swallow were minimally cleared on subsequent swallows. Numerous strategies attempted; the Mendelsohn maneuver was most effective in clearing bolus past the UES; however pt continued to have significant pharyngeal residuals. Given these findings, pt is at a severe risk of aspiration and inadequate nutrition. Pt reported having discussion with MD regarding possible PEG placement and that this is under consideration. Will defer diet decision to MD; safest diet if pursued would be thin liquid only with strategies in place but pt would still be at severe risk of aspiration. Pt would be a great candidate for a free water protocol following thorough oral care for comfort. Pt may also have some short-term benefit  from pharyngeal swallow exercises although prognosis for significant improvement is guarded. Will continue to follow for education/ review of compensatory strategies/ exercises if a diet or free water protocol is desired.  SLP Visit Diagnosis Dysphagia, pharyngoesophageal phase (R13.14) Attention and concentration deficit following -- Frontal lobe and executive function deficit following -- Impact on safety and function Severe aspiration risk;Risk for inadequate nutrition/hydration   CHL IP TREATMENT RECOMMENDATION 12/07/2016 Treatment Recommendations Therapy as outlined in treatment plan below   Prognosis 12/07/2016 Prognosis for Safe Diet Advancement Guarded Barriers to Reach Goals Severity of deficits Barriers/Prognosis Comment -- CHL IP DIET RECOMMENDATION 12/07/2016 SLP Diet Recommendations NPO;Alternative means - long-term;Free water protocol after oral care Liquid Administration via Straw Medication Administration Via alternative means Compensations Slow rate;Small sips/bites;Multiple dry swallows after each bite/sip;Other (Comment)- Mendelsohn maneuver Postural Changes Remain semi-upright after after feeds/meals (Comment);Seated upright at 90 degrees   CHL IP OTHER RECOMMENDATIONS 12/07/2016 Recommended Consults -- Oral Care Recommendations Oral care QID Other Recommendations Have oral suction available   CHL IP FOLLOW UP RECOMMENDATIONS 12/07/2016 Follow up Recommendations 24 hour supervision/assistance   CHL IP FREQUENCY AND DURATION 12/07/2016 Speech Therapy Frequency (ACUTE ONLY) min 2x/week Treatment Duration 1 week      CHL IP ORAL PHASE 12/07/2016 Oral Phase WFL Oral - Pudding Teaspoon -- Oral - Pudding Cup -- Oral - Honey Teaspoon -- Oral - Honey Cup -- Oral - Nectar Teaspoon -- Oral - Nectar Cup -- Oral - Nectar Straw -- Oral - Thin Teaspoon -- Oral - Thin Cup -- Oral - Thin Straw -- Oral - Puree -- Oral - Mech Soft -- Oral - Regular -- Oral - Multi-Consistency -- Oral - Pill -- Oral Phase - Comment --   CHL IP PHARYNGEAL PHASE 12/07/2016 Pharyngeal Phase Impaired Pharyngeal- Pudding Teaspoon -- Pharyngeal -- Pharyngeal- Pudding Cup -- Pharyngeal -- Pharyngeal- Honey Teaspoon -- Pharyngeal -- Pharyngeal- Honey Cup -- Pharyngeal -- Pharyngeal- Nectar Teaspoon -- Pharyngeal -- Pharyngeal- Nectar Cup -- Pharyngeal -- Pharyngeal- Nectar Straw -- Pharyngeal -- Pharyngeal- Thin Teaspoon Reduced pharyngeal peristalsis;Reduced epiglottic inversion;Reduced anterior laryngeal mobility;Reduced laryngeal elevation;Reduced airway/laryngeal closure;Penetration/Aspiration during swallow;Penetration/Apiration after swallow;Pharyngeal residue - valleculae;Pharyngeal residue - pyriform;Pharyngeal residue - cp segment;Compensatory strategies attempted (with notebox) Pharyngeal (No Data) Pharyngeal- Thin Cup -- Pharyngeal -- Pharyngeal- Thin Straw Reduced pharyngeal peristalsis;Reduced epiglottic inversion;Reduced anterior laryngeal mobility;Reduced laryngeal elevation;Reduced airway/laryngeal closure;Penetration/Aspiration during swallow;Penetration/Apiration after swallow;Pharyngeal residue - valleculae;Pharyngeal residue - pyriform;Pharyngeal residue - cp segment;Compensatory strategies attempted (with notebox) Pharyngeal (No Data) Pharyngeal- Puree Reduced pharyngeal peristalsis;Reduced epiglottic inversion;Reduced anterior laryngeal mobility;Reduced laryngeal elevation;Reduced airway/laryngeal closure;Pharyngeal residue - valleculae;Pharyngeal residue - pyriform;Pharyngeal residue - cp segment Pharyngeal -- Pharyngeal- Mechanical Soft -- Pharyngeal --  Pharyngeal- Regular -- Pharyngeal -- Pharyngeal- Multi-consistency -- Pharyngeal -- Pharyngeal- Pill -- Pharyngeal -- Pharyngeal Comment --  CHL IP CERVICAL ESOPHAGEAL PHASE 12/07/2016 Cervical Esophageal Phase Impaired Pudding Teaspoon -- Pudding Cup -- Honey Teaspoon -- Honey Cup -- Nectar Teaspoon -- Nectar Cup -- Nectar Straw -- Thin Teaspoon Reduced cricopharyngeal  relaxation;Prominent cricopharyngeal segment Thin Cup -- Thin Straw Reduced cricopharyngeal relaxation;Prominent cricopharyngeal segment Puree Reduced cricopharyngeal relaxation;Prominent cricopharyngeal segment Mechanical Soft -- Regular -- Multi-consistency -- Pill -- Cervical Esophageal Comment -- No flowsheet data found. Metro Kung, MA, CCC-SLP 12/07/2016, 2:32 PM [id number]              Tillman Sers, DO 12/08/2016, 8:11 AM PGY-1, [facility] Family Medicine FPTS Intern pager: [phone], text pages welcome

## 2016-12-08 NOTE — Progress Notes (Signed)
NIF -50

## 2016-12-08 NOTE — Clinical Social Work Note (Signed)
Clinical Social Work Assessment  Patient Details  Name: George Evans MRN: [id number] Date of Birth: 13-May-1956  Date of referral:  12/08/16               Reason for consult:  Discharge Planning, Facility Placement                Permission sought to share information with:  Family Supports Permission granted to share information::  Yes, Verbal Permission Granted  Name::     George Evans  Agency::  SNF  Relationship::  Wife  Contact Information:  [phone]  Housing/Transportation Living arrangements for the past 2 months:  [place] (patient has been living in varies SNF since 2013) Source of Information:  Patient, Spouse Patient Interpreter Needed:  None Criminal Activity/Legal Involvement Pertinent to Current Situation/Hospitalization:  No - Comment as needed Significant Relationships:  Spouse, Siblings Lives with:  Facility Resident Do you feel safe going back to the place where you live?  Yes Need for family participation in patient care:  No (Coment)  Care giving concerns: Patient has two sisters and spouse at bedside who were very supportive and appropriate with patient. Patient stated "they are his life line"  Social Worker assessment / plan:  Holiday representative met with patient and family at bedside to offer support and discuss discharge needs. CSW stated to family she was consulted by [facility] to find a facility that would be able to take patient with a trilogy ventilator. CSW wanted to make family aware that a lot of facilities in the area are not trained on that particular equipment and if patient goes out on that equipment and its placed on a ventilator then [place] will have to look at facility out of state. Patient stated that he does not want to go out of state and will need to be close to family to preserve his quality of life. CSW stated to family that the only facility that will take a trilogy vent is in [facility] [employer]) but it has to be  on a c-pap or Bi-pap setting.  Employment status:  Retired Nurse, adult PT Recommendations:  Not assessed at this time Information / Referral to community resources:  [place]  Patient/Family's Response to care: Patient/family appreciate CSW role in patients care.  Patient/Family's Understanding of and Emotional Response to Diagnosis, Current Treatment, and Prognosis:  Patient/family with good understanding of current medical state   Emotional Assessment Appearance:  Appears stated age Attitude/Demeanor/Rapport:  Other (very nice man) Affect (typically observed):  Pleasant, Calm, Appropriate Orientation:  Oriented to Place, Oriented to  Time, Oriented to Situation, Oriented to Self Alcohol / Substance use:  Not Applicable Psych involvement (Current and /or in the community):  No (Comment)  Discharge Needs  Concerns to be addressed:  No discharge needs identified Readmission within the last 30 days:  Yes Current discharge risk:  Dependent with Mobility Barriers to Discharge:  No Barriers Identified, Continued Medical Work up   [employer], LCSW 12/08/2016, 8:45 AM

## 2016-12-08 NOTE — Progress Notes (Signed)
Daily Progress Note   Patient Name: George Evans       Date: 12/08/2016 DOB: 08/06/1955  Age: 61 y.o. MRN#: [id number] Attending Physician: Carney Living, MD Primary Care Physician: York Spaniel, MD Admit Date: 11/13/2016  Reason for Consultation/Follow-up: Establishing goals of care  Subjective/GOC: Patient awake, alert, and sitting up in bed this morning. States "I feel better." Denies pain or discomfort.   Wife, mother, and three sisters at bedside. Long GOC discussion with patient and family regarding diagnoses, interventions, guarded prognosis, high risk for aspiration, advanced directives, and EOL wishes. Patient of sound mind and able to make his own decisions at this point. He wants to "try the PEG." He is hopeful that he will regain some strength. He speaks of "tasting liquids" but suctions them out so he doesn't swallow them.   He speaks of not wanting trilogy if he has to go out of state. "I will die if I am away from my family." He would rather have less time and closer to family. Family would requesting to continue BiPAP on discharge.   Quality for Mr. Standage is sitting up in wheelchair and spending time with his family. If he can return to this pre-admission baseline with feeding tube, he will have quality. When he reaches the point of being completely bed bound, he would rather be kept comfortable and die.   Discussed at length risk for aspiration leading to an acute respiratory event and decompensation requiring intubation/ventilation. Educated on my recommendation for DNR/DNI with underlying LGMD and unlikelihood of him surviving/coming off a vent. At this point, patient requests to remain a FULL code. He would want resuscitation attempted and be ventilated short term.  If unable to wean from ventilator, wife would allow time for family to arrive before making him comfortable.   MOST form discussed and completed in chart. Educated on importance of documenting his EOL wishes and encouraged patient/wife complete advanced directive packet with chaplain.  Answered many questions and concerns. Emotional and spiritual support provided.   **After this conversation, spoke with wife outside of the room. She is very realistic about poor prognosis and need for hospice services. She does not believe aggressive medical interventions (like PEG/trach) will give him quality but also respects his decisions while he is of sound mind. She feels he is  at peace when he does die (very spiritual individual) but is struggling with decisions because "he does not want to leave family." She states "one day at a time." Reassured continued support from palliative services on discharge to continue GOC conversations and facilitate hospice.    Length of Stay: 2  Current Medications: Scheduled Meds:  . acetylcysteine  4 mL Nebulization BID  . albuterol  2.5 mg Nebulization BID AC & HS  . enoxaparin (LOVENOX) injection  1 mg/kg Subcutaneous Q12H  . fluticasone  2 spray Each Nare Daily  . senna-docusate  1 tablet Oral QHS    Continuous Infusions: . sodium chloride 0.9 % 1,000 mL with potassium chloride 40 mEq infusion 250 mL/hr at 12/08/16 0808    PRN Meds: guaiFENesin  Physical Exam  Constitutional: He is oriented to person, place, and time. He appears cachectic. He is cooperative. He appears ill.  HENT:  Head: Normocephalic and atraumatic.  Cardiovascular: Regular rhythm.   Pulmonary/Chest: Effort normal. He has decreased breath sounds.  Abdominal: Normal appearance.  Neurological: He is alert and oriented to person, place, and time.  Skin: Skin is warm and dry.  Psychiatric: He has a normal mood and affect. His speech is normal and behavior is normal.  Nursing note and vitals  reviewed.          Vital Signs: BP 111/71   Pulse 94   Temp 98 F (36.7 C) (Oral)   Resp (!) 21   Ht 6' (1.829 m)   Wt 70.3 kg (155 lb)   SpO2 100%   BMI 21.02 kg/m  SpO2: SpO2: 100 % O2 Device: O2 Device: Nasal Cannula O2 Flow Rate: O2 Flow Rate (L/min): 1 L/min  Intake/output summary:   Intake/Output Summary (Last 24 hours) at 12/08/16 1034 Last data filed at 12/08/16 0900  Gross per 24 hour  Intake          2780.83 ml  Output              325 ml  Net          2455.83 ml   LBM:   Baseline Weight: Weight: 70.3 kg (155 lb) Most recent weight: Weight: 70.3 kg (155 lb)  Palliative Assessment/Data: PPS 30%   Flowsheet Rows     Most Recent Value  Intake Tab  Referral Department  Hospitalist  Unit at Time of Referral  Intermediate Care Unit  Palliative Care Primary Diagnosis  Pulmonary  Date Notified  11/23/2016  Palliative Care Type  New Palliative care  Reason for referral  Clarify Goals of Care  Date of Admission  11/12/2016  Date first seen by Palliative Care  12/07/16  # of days Palliative referral response time  1 Day(s)  # of days IP prior to Palliative referral  0  Clinical Assessment  Palliative Performance Scale Score  30%  Psychosocial & Spiritual Assessment  Palliative Care Outcomes  Patient/Family meeting held?  Yes  Who was at the meeting?  patient, wife, sister  Palliative Care Outcomes  Clarified goals of care, Counseled regarding hospice, ACP counseling assistance, Provided psychosocial or spiritual support      Patient Active Problem List   Diagnosis Date Noted  . Moderate protein-calorie malnutrition (HCC)   . Dysphagia   . Palliative care by specialist   . Goals of care, counseling/discussion   . SOB (shortness of breath)   . Acute neuromuscular respiratory failure (HCC) 11/23/2016  . 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  MS (multiple sclerosis) (HCC)   . Aspiration into airway 11/27/2016  . Choking    . Muscular weakness   . Respiratory failure, chronic neuromuscular (HCC) 09/14/2016  . 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    Palliative Care Assessment & Plan   Patient Profile: 61 y.o. male  with past medical history of limb-girdle muscular dystrophy, PVD, PE on xarelto, depression, viral hepatitis C, and dysphagia admitted on 12/07/2016 with shortness of breath. Recent hospitalization from 6/16-6/19 for acute respiratory failure due to poor secretion clearance. Discharged to SNF with orders for HS BiPAP but there was complications with BiPAP at the facility. In ED, patient placed on BiPAP. Patient with acute on chronic respiratory failure in the setting of progressive muscular dystrophy. PRN and HS BiPAP. Receiving chest physical therapy, nebs, flonase, robitussin, and robinul. Per PCCM, may need elective PEG placement. Palliative medicine consultation for goals of care.   Assessment: Acute respiratory failure due to mucus plugging Limb-girdle muscular dystrophy Hypokalemia Dysphagia High risk for aspiration Protein-calorie malnutrition  Recommendations/Plan:  Long GOC discussion with patient and family. Patient wishes to remain a FULL code and pursue PEG tube placement.   He does NOT want to go out of state for trilogy. Requesting to continue BiPAP. Would rather have less time but be with family.   Patient remains hopeful to return to pre-admission baseline--sitting up in wheelchair daily along with feeding tube. This is QUALITY for him.   MOST form completed and placed in chart.   Wife very realistic with poor prognosis and need for hospice. Patient/family need continued GOC conversations.   Please have palliative services follow on discharge. Wife understands this can transition to hospice services when they are ready.   Goals of Care and Additional Recommendations:  Limitations on Scope of Treatment: Full Scope  Treatment  Code Status: FULL   Code Status Orders        Start     Ordered   11/29/2016 1908  Full code  Continuous     11/19/2016 1907    Code Status History    Date Active Date Inactive Code Status Order ID Comments User Context   11/27/2016  4:51 PM 11/30/2016  7:32 PM Full Code [id number]  Casey Burkitt, MD ED       Prognosis:   Unable to determine: guarded with high risk for aspiration, decompensation requiring intubation/mechanical ventilation, decreased functional and nutritional status, and underlying progressive muscular dystrophy.   Discharge Planning:  To Be Determined: SNF with palliative services to follow  Care plan was discussed with patient, wife, multiple sisters, RN, PCCM, and FMTS  Thank you for allowing the Palliative Medicine Team to assist in the care of this patient.   Time In: 0920 Time Out: 1050 Total Time 90 min Prolonged Time Billed  yes      Greater than 50%  of this time was spent counseling and coordinating care related to the above assessment and plan.  Vennie Homans, FNP-C Palliative Medicine Team  Phone: [phone] Fax: [phone]  Please contact Palliative Medicine Team phone at [phone] for questions and concerns.

## 2016-12-08 NOTE — Progress Notes (Signed)
Name: Lori Liew MRN: [id number] DOB: [birth year]    ADMISSION DATE:  12-17-16 CONSULTATION DATE:  6/25   REFERRING MD :  Dr. 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 His muscular dystrophy has been starting to affect his respiratory muscles. He was recently admitted to [facility] 6/16 > 6/19 after he was thought to have aspirated a pill. This was not found to be the case on CXR or bronchoscopy. Course complicated by acute on chronic respiratory failure secondary to excessive secretions and progression of MD. He was started on Trilogy Vent, Chest PT, and suctioning with improvement. Unable to be discharged on Trilogy so he was recommended nocturnal BiPAP, which he tolerated well and was discharged to [facility].  Since discharge he has been unable to use BiPAP due to some issue at facility. Initially he was doing OK, however, 6/25 he developed acute onset dyspnea for which he presented to [facility] Emergency department. He complains of sputum he is unable to clear sitting in his chest. He does not feel as though this is food/aspiration related. In the emergency department he was started on BiPAP, which did successfully ease his work of breathing. He was admitted to the [facility] Medicine Teaching Service. PCCM was asked to see for further evaluation.  SIGNIFICANT EVENTS  6/16 > 6/19 admit for respiratory failure and ? Pill aspiration. 6/25 > admit with dyspnea  STUDIES:  Admit CXR > L hemidiaphragmatic elevation and associated ATX  SUBJECTIVE:  Doing better today. Wore BiPAP overnight for only 2 hours, hasn't been back on today. Breathing comfortably. Failed SLP eval.  VITAL SIGNS: Temp:  [97.3 F (36.3 C)-98 F (36.7 C)] 98 F (36.7 C) (06/27 0920) Pulse Rate:  [63-104] 94 (06/27 0920) Resp:  [14-21]  21 (06/27 0920) BP: (102-114)/(58-83) 111/71 (06/27 0920) SpO2:  [94 %-100 %] 100 % (06/27 0920)  PHYSICAL EXAMINATION: General:  Frail adult male in NAD Neuro:  Alert, oriented, non-focal HEENT:  [place]/AT, PERRL, no JVD Cardiovascular:  RRR, no MRG Lungs:  Some rhonchi, improved. No distress Abdomen:  Soft, non-tender, non-distended Musculoskeletal:  No acute deformity Skin:  Grossly intact   Recent Labs Lab Dec 17, 2016 1715 12/07/16 0820 12/08/16 0255  NA 142 145 144  K 3.4* 2.8* <2.0*  CL 103 109 111  CO2 24 28 28   BUN 8 5* <5*  CREATININE 0.39* <0.30* <0.30*  GLUCOSE 91 148* 138*    Recent Labs Lab 12/17/16 1715  HGB 16.6  HCT 50.6  WBC 8.6  PLT 298   Dg Chest 1 View  Result Date: 12/17/2016 CLINICAL DATA:  Shortness of breath and cough EXAM: CHEST 1 VIEW COMPARISON:  Chest radiograph 11/27/2016 FINDINGS: Unchanged elevation of the left hemidiaphragm with associated left basilar atelectasis. Lungs are otherwise clear. Normal cardiomediastinal contours. IMPRESSION: Unchanged left hemidiaphragmatic elevation and associated atelectasis. Electronically Signed   By: Deatra Robinson M.D.   On: 17-Dec-2016 13:46   Dg Swallowing Func-speech Pathology  Result Date: 12/07/2016 Objective Swallowing Evaluation: Type of Study: MBS-Modified Barium Swallow Study Patient Details Name: Lary Eckardt MRN: [id number] Date of Birth: 23-Nov-1955 Today's Date: 12/07/2016 Time: SLP Start Time (ACUTE ONLY): 1340-SLP Stop Time (ACUTE ONLY): 1420 SLP Time Calculation (min) (ACUTE ONLY): 40 min Past Medical History: Past Medical History: Diagnosis Date . Abnormal posture  .  Chronic viral hepatitis C (HCC)  . Constipation  . Difficulty walking  . Distal muscular dystrophy (HCC)  . 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 Major depressive disorder  . Muscle wasting  . Muscle weakness  . Other diseases of bronchus, not elsewhere classified (CODE)  . Peripheral vascular disease (HCC)  . Pressure ulcer of  sacral region  Past Surgical History: Past Surgical History: Procedure Laterality Date . APPENDECTOMY  1992 . OTHER SURGICAL HISTORY  2016  Abcess removed from penis . ROTATOR CUFF REPAIR Right 1989 HPI: Pt is a 61 y.o. male with PMH of limb-girdle muscular dystrophy, multiple PEs, and previously treated hepatitis C, presenting to ED on 6/25 with SOB and mucus plugging/ inability to handle secretions, intermittently requiring BiPAP in ED. Pt reportedly not eating much due to difficulty swallowing. Pt previously seen on recent admission after concern for pill aspiration (11/28/16). A MBS was completed March 2018 at outside facility- report unavailable but per SLP note 11/28/16 pt reported being told to have small bites/ sips and that there was a high aspiration risk although aspiration not observed during study. During previous evaluation recommendation was for a dysphagia 3 diet, thin liquids, meds crushed in puree along with compensatory strategies- multiple swallows, follow solids with liquid, effortful swallow. Bedside swallow eval ordered to re-evaluate.  Subjective: pt alert, says his swallowing feels unchanged since prior MBS, has particularly things he will or will not eat Assessment / Plan / Recommendation CHL IP CLINICAL IMPRESSIONS 12/07/2016 Clinical Impression Pt currently with a severe pharyngeal phase/ cervical esophageal phase, motor-based dysphagia. Pharyngeal phase characterized by significantly reduced epiglottic inversion/ pharyngeal peristalsis and reduced hyolaryngeal excursion resulting in reduced airway closure and aspiration of thin liquids, both during the swallow and then after the swallow due to residuals. Pt with very weak cough and unable to clear aspirated material. Very small amounts (25% at most) of the bolus actually appeared to pass through the UES. At the cervical esophageal level, there is an appearance of a prominent UES and reduced UES opening, also impacting swallow function,  along with poor head/ trunk control which at times appeared to push bolus forward toward airway. Significant residuals in the pharyngeal spaces post-swallow were minimally cleared on subsequent swallows. Numerous strategies attempted; the Mendelsohn maneuver was most effective in clearing bolus past the UES; however pt continued to have significant pharyngeal residuals. Given these findings, pt is at a severe risk of aspiration and inadequate nutrition. Pt reported having discussion with MD regarding possible PEG placement and that this is under consideration. Will defer diet decision to MD; safest diet if pursued would be thin liquid only with strategies in place but pt would still be at severe risk of aspiration. Pt would be a great candidate for a free water protocol following thorough oral care for comfort. Pt may also have some short-term benefit from pharyngeal swallow exercises although prognosis for significant improvement is guarded. Will continue to follow for education/ review of compensatory strategies/ exercises if a diet or free water protocol is desired.  SLP Visit Diagnosis Dysphagia, pharyngoesophageal phase (R13.14) Attention and concentration deficit following -- Frontal lobe and executive function deficit following -- Impact on safety and function Severe aspiration risk;Risk for inadequate nutrition/hydration   CHL IP TREATMENT RECOMMENDATION 12/07/2016 Treatment Recommendations Therapy as outlined in treatment plan below   Prognosis 12/07/2016 Prognosis for Safe Diet Advancement Guarded Barriers to Reach Goals Severity of deficits Barriers/Prognosis Comment -- CHL IP DIET RECOMMENDATION 12/07/2016 SLP Diet Recommendations NPO;Alternative means -  long-term;Free water protocol after oral care Liquid Administration via Straw Medication Administration Via alternative means Compensations Slow rate;Small sips/bites;Multiple dry swallows after each bite/sip;Other (Comment)- Mendelsohn maneuver Postural  Changes Remain semi-upright after after feeds/meals (Comment);Seated upright at 90 degrees   CHL IP OTHER RECOMMENDATIONS 12/07/2016 Recommended Consults -- Oral Care Recommendations Oral care QID Other Recommendations Have oral suction available   CHL IP FOLLOW UP RECOMMENDATIONS 12/07/2016 Follow up Recommendations 24 hour supervision/assistance   CHL IP FREQUENCY AND DURATION 12/07/2016 Speech Therapy Frequency (ACUTE ONLY) min 2x/week Treatment Duration 1 week      CHL IP ORAL PHASE 12/07/2016 Oral Phase WFL Oral - Pudding Teaspoon -- Oral - Pudding Cup -- Oral - Honey Teaspoon -- Oral - Honey Cup -- Oral - Nectar Teaspoon -- Oral - Nectar Cup -- Oral - Nectar Straw -- Oral - Thin Teaspoon -- Oral - Thin Cup -- Oral - Thin Straw -- Oral - Puree -- Oral - Mech Soft -- Oral - Regular -- Oral - Multi-Consistency -- Oral - Pill -- Oral Phase - Comment --  CHL IP PHARYNGEAL PHASE 12/07/2016 Pharyngeal Phase Impaired Pharyngeal- Pudding Teaspoon -- Pharyngeal -- Pharyngeal- Pudding Cup -- Pharyngeal -- Pharyngeal- Honey Teaspoon -- Pharyngeal -- Pharyngeal- Honey Cup -- Pharyngeal -- Pharyngeal- Nectar Teaspoon -- Pharyngeal -- Pharyngeal- Nectar Cup -- Pharyngeal -- Pharyngeal- Nectar Straw -- Pharyngeal -- Pharyngeal- Thin Teaspoon Reduced pharyngeal peristalsis;Reduced epiglottic inversion;Reduced anterior laryngeal mobility;Reduced laryngeal elevation;Reduced airway/laryngeal closure;Penetration/Aspiration during swallow;Penetration/Apiration after swallow;Pharyngeal residue - valleculae;Pharyngeal residue - pyriform;Pharyngeal residue - cp segment;Compensatory strategies attempted (with notebox) Pharyngeal (No Data) Pharyngeal- Thin Cup -- Pharyngeal -- Pharyngeal- Thin Straw Reduced pharyngeal peristalsis;Reduced epiglottic inversion;Reduced anterior laryngeal mobility;Reduced laryngeal elevation;Reduced airway/laryngeal closure;Penetration/Aspiration during swallow;Penetration/Apiration after swallow;Pharyngeal  residue - valleculae;Pharyngeal residue - pyriform;Pharyngeal residue - cp segment;Compensatory strategies attempted (with notebox) Pharyngeal (No Data) Pharyngeal- Puree Reduced pharyngeal peristalsis;Reduced epiglottic inversion;Reduced anterior laryngeal mobility;Reduced laryngeal elevation;Reduced airway/laryngeal closure;Pharyngeal residue - valleculae;Pharyngeal residue - pyriform;Pharyngeal residue - cp segment Pharyngeal -- Pharyngeal- Mechanical Soft -- Pharyngeal -- Pharyngeal- Regular -- Pharyngeal -- Pharyngeal- Multi-consistency -- Pharyngeal -- Pharyngeal- Pill -- Pharyngeal -- Pharyngeal Comment --  CHL IP CERVICAL ESOPHAGEAL PHASE 12/07/2016 Cervical Esophageal Phase Impaired Pudding Teaspoon -- Pudding Cup -- Honey Teaspoon -- Honey Cup -- Nectar Teaspoon -- Nectar Cup -- Nectar Straw -- Thin Teaspoon Reduced cricopharyngeal relaxation;Prominent cricopharyngeal segment Thin Cup -- Thin Straw Reduced cricopharyngeal relaxation;Prominent cricopharyngeal segment Puree Reduced cricopharyngeal relaxation;Prominent cricopharyngeal segment Mechanical Soft -- Regular -- Multi-consistency -- Pill -- Cervical Esophageal Comment -- No flowsheet data found. Amy Cecille Aver, MA, CCC-SLP 12/07/2016, 2:32 PM [phone]              ASSESSMENT / PLAN:  Acute on chronic respiratory failure:with hypoventilation in the setting of progressive muscular dystrophy complicated by impaired clearance of respiratory secretions.  PLAN -  Continue mucinex, mucomyst, chest PT  PRN bipap daytime  Mandatory qhs bipap  Ongoing discussions regarding trach - he is not ready for this  PEG as below  Robinul discontinued  Pulmonary hygiene as able   Malnutrition: He is already very behind in this regard and a decision will need to be made in the next 24> 48 hours how we will provide him nutrition PLAN -  Full liquids ordered per FPTS after discussions with pt -- he understands significant aspiration risk  Should still plan for  PEG prior to d/c  Speech following    Need ongoing goals of care discussions. Discussed at length 6/27 with pt  and wife.  He wants to remain a full code for now and wife explains that this is mostly in the event of cardiac arrest to "keep him alive until family gets here".  They seem to have a clear understanding that pt would not want any significant time on the vent and wife "knows to take me off and let me go".  Discussed aspiration risk and fact that he would be highly unlikely to come off vent.  Palliative care NP also at bedside.   Dirk Dress, NP 12/08/2016  9:43 AM Pager: [phone] or [phone]

## 2016-12-08 NOTE — Progress Notes (Signed)
CRITICAL VALUE ALERT  Critical Value:  Potassium <2.0  Date & Time Notied:  12/08/16 0424  Provider Notified: yes at 0424  Orders Received/Actions taken: yes

## 2016-12-08 NOTE — Progress Notes (Signed)
IR aware of G-tube request.  CT scan needs to be completed to review anatomy.  George Evans E 11:47 AM 12/08/2016

## 2016-12-08 NOTE — Progress Notes (Signed)
Patient is resting comfortably on room air. No respiratory distress noted. BIPAP is not needed at this time. RT will monitor as needed.

## 2016-12-09 ENCOUNTER — Inpatient Hospital Stay (HOSPITAL_COMMUNITY): Payer: Medicare Other

## 2016-12-09 ENCOUNTER — Encounter (HOSPITAL_COMMUNITY): Payer: Self-pay | Admitting: General Surgery

## 2016-12-09 HISTORY — PX: IR GASTROSTOMY TUBE MOD SED: IMG625

## 2016-12-09 LAB — BASIC METABOLIC PANEL
Anion gap: 8 (ref 5–15)
CALCIUM: 9.1 mg/dL (ref 8.9–10.3)
CO2: 25 mmol/L (ref 22–32)
Chloride: 111 mmol/L (ref 101–111)
GLUCOSE: 73 mg/dL (ref 65–99)
POTASSIUM: 4.4 mmol/L (ref 3.5–5.1)
Sodium: 144 mmol/L (ref 135–145)

## 2016-12-09 LAB — CBC
HCT: 40.6 % (ref 39.0–52.0)
Hemoglobin: 12.8 g/dL — ABNORMAL LOW (ref 13.0–17.0)
MCH: 27.7 pg (ref 26.0–34.0)
MCHC: 31.5 g/dL (ref 30.0–36.0)
MCV: 87.9 fL (ref 78.0–100.0)
Platelets: 210 10*3/uL (ref 150–400)
RBC: 4.62 MIL/uL (ref 4.22–5.81)
RDW: 17.1 % — AB (ref 11.5–15.5)
WBC: 4.5 10*3/uL (ref 4.0–10.5)

## 2016-12-09 LAB — PROTIME-INR
INR: 1.12
Prothrombin Time: 14.4 seconds (ref 11.4–15.2)

## 2016-12-09 MED ORDER — KETOROLAC TROMETHAMINE 15 MG/ML IJ SOLN
INTRAMUSCULAR | Status: AC
  Administered 2016-12-09: 15 mg
  Filled 2016-12-09: qty 1

## 2016-12-09 MED ORDER — KETOROLAC TROMETHAMINE 15 MG/ML IJ SOLN
15.0000 mg | Freq: Once | INTRAMUSCULAR | Status: AC
  Administered 2016-12-09: 15 mg via INTRAVENOUS

## 2016-12-09 MED ORDER — FENTANYL CITRATE (PF) 100 MCG/2ML IJ SOLN
INTRAMUSCULAR | Status: AC
  Filled 2016-12-09: qty 4

## 2016-12-09 MED ORDER — OXYCODONE HCL 5 MG PO TABS: 5.0000 mg | ORAL_TABLET | Freq: Once | ORAL | Status: DC

## 2016-12-09 MED ORDER — LIDOCAINE HCL (PF) 1 % IJ SOLN
INTRAMUSCULAR | Status: AC
  Filled 2016-12-09: qty 30

## 2016-12-09 MED ORDER — CEFAZOLIN SODIUM-DEXTROSE 2-4 GM/100ML-% IV SOLN
2.0000 g | INTRAVENOUS | Status: AC
  Administered 2016-12-09: 2 g via INTRAVENOUS
  Filled 2016-12-09: qty 100

## 2016-12-09 MED ORDER — SENNOSIDES-DOCUSATE SODIUM 8.6-50 MG PO TABS: 1.0000 | ORAL_TABLET | Freq: Every evening | ORAL | Status: DC | PRN

## 2016-12-09 MED ORDER — IOPAMIDOL (ISOVUE-300) INJECTION 61%
INTRAVENOUS | Status: AC
  Administered 2016-12-09: 25 mL
  Filled 2016-12-09: qty 50

## 2016-12-09 MED ORDER — MIDAZOLAM HCL 2 MG/2ML IJ SOLN
INTRAMUSCULAR | Status: AC | PRN
  Administered 2016-12-09 (×2): 0.5 mg via INTRAVENOUS

## 2016-12-09 MED ORDER — CEFAZOLIN SODIUM-DEXTROSE 2-4 GM/100ML-% IV SOLN
INTRAVENOUS | Status: AC
  Filled 2016-12-09: qty 100

## 2016-12-09 MED ORDER — GLUCAGON HCL RDNA (DIAGNOSTIC) 1 MG IJ SOLR
INTRAMUSCULAR | Status: AC | PRN
  Administered 2016-12-09: 1 mg via INTRAVENOUS

## 2016-12-09 MED ORDER — GLUCAGON HCL RDNA (DIAGNOSTIC) 1 MG IJ SOLR
INTRAMUSCULAR | Status: AC
  Filled 2016-12-09: qty 1

## 2016-12-09 MED ORDER — LIDOCAINE HCL 1 % IJ SOLN
INTRAMUSCULAR | Status: AC | PRN
  Administered 2016-12-09: 10 mL

## 2016-12-09 MED ORDER — ENOXAPARIN SODIUM 80 MG/0.8ML ~~LOC~~ SOLN
1.0000 mg/kg | Freq: Two times a day (BID) | SUBCUTANEOUS | Status: DC
  Administered 2016-12-10: 11:00:00 70 mg via SUBCUTANEOUS
  Filled 2016-12-09: qty 0.8

## 2016-12-09 MED ORDER — HYDROMORPHONE HCL 1 MG/ML IJ SOLN: 0.5000 mg | Freq: Once | INTRAMUSCULAR | Status: DC

## 2016-12-09 MED ORDER — ACETAMINOPHEN 325 MG PO TABS: 650.0000 mg | ORAL_TABLET | Freq: Four times a day (QID) | ORAL | Status: DC | PRN

## 2016-12-09 MED ORDER — MIDAZOLAM HCL 2 MG/2ML IJ SOLN
INTRAMUSCULAR | Status: AC
  Filled 2016-12-09: qty 4

## 2016-12-09 MED ORDER — FENTANYL CITRATE (PF) 100 MCG/2ML IJ SOLN
INTRAMUSCULAR | Status: AC | PRN
  Administered 2016-12-09 (×3): 25 ug via INTRAVENOUS

## 2016-12-09 NOTE — Progress Notes (Addendum)
4pm CSW and RNCM met with pt and family to discuss options.  Pt has decided to go back to [place] [facility] with normal bipap ([facility] to order new bipap since last one did not work properly).  CSW spoke with [facility] and they can accept back as long as room available  Per family plan for PEG tube placement today vs tomorrow- CSW will continue to follow and assist with placement when pt is stable.  1:50pm  This is first day at pt CSW- spoke with past CSW concerning pt plan  - per previous [facility] is capable of handling trilogy on bipap or cpap settings but NOT on ventilator setting so only an option if not requiring vent setting  - patient now being considered for trach/vent placement but patient not wanting to pursue unless [facility] placement can be guarantee there is no way to guarantee that [facility] [facility]F can accept at time of DC and there is a possibility of needing out of state placement if no availability in [place]  CSW will continue to follow  Jorge Ny, [place] Worker [phone]

## 2016-12-09 NOTE — Progress Notes (Signed)
Daily Progress Note   Patient Name: George Evans       Date: 12/09/2016 DOB: 1956-02-06  Age: 61 y.o. MRN#: [id number] Attending Physician: George Covert, MD Primary Care Physician: George Ducking, MD Admit Date: 12/11/2016  Reason for Consultation/Follow-up: Establishing goals of care  Subjective/GOC: Met with patient and sister at bedside. He is awake and alert. Denies pain or discomfort. Plan is for PEG today. After hospitalization, he is hopeful to return to SNF and OOB to wheelchair daily (pre hospital baseline). He again speaks of this being quality for him. He again declines any placement out of state.   Shortly after, spoke with wife George Evans) in private. She speaks of him having a dream last night where the "Lord wrapped his arms around him and told him he would never leave." She feels George Evans understands time is short and is "at peace with dying." Knowing this, she has discussed code status with him and encouraged he consider no resuscitation or intubation. She plans to continue EOL conversations with him. She confirms PEG placement today and likely return to SNF with BiPAP until "this cycle happens again." She remains realistic that it is "just a matter of time" and does not want "to see him suffer."   Answered questions and concerns. Reassured continued support through hospitalization.     Length of Stay: 3  Current Medications: Scheduled Meds:  . acetylcysteine  4 mL Nebulization BID  . albuterol  2.5 mg Nebulization BID AC & HS  . enoxaparin (LOVENOX) injection  1 mg/kg Subcutaneous Q12H  . fluticasone  2 spray Each Nare Daily  . senna-docusate  1 tablet Oral QHS    Continuous Infusions: . sodium chloride 0.9 % 1,000 mL with potassium chloride 40 mEq infusion 125 mL/hr  at 12/09/16 0600    PRN Meds: guaiFENesin  Physical Exam  Constitutional: He is oriented to person, place, and time. He appears cachectic. He is cooperative. He appears ill.  HENT:  Head: Normocephalic and atraumatic.  Cardiovascular: Regular rhythm.   Pulmonary/Chest: Effort normal. He has decreased breath sounds.  Abdominal: Normal appearance.  Neurological: He is alert and oriented to person, place, and time.  Skin: Skin is warm and dry.  Psychiatric: He has a normal mood and affect. His speech is normal and behavior is normal.  Nursing  note and vitals reviewed.          Vital Signs: BP 103/75 (BP Location: Left Arm)   Pulse (!) 103   Temp 97.7 F (36.5 C) (Oral)   Resp 19   Ht 6' (1.829 m)   Wt 70.3 kg (155 lb)   SpO2 99%   BMI 21.02 kg/m  SpO2: SpO2: 99 % O2 Device: O2 Device: Not Delivered O2 Flow Rate: O2 Flow Rate (L/min): 1 L/min  Intake/output summary:   Intake/Output Summary (Last 24 hours) at 12/09/16 1230 Last data filed at 12/09/16 0600  Gross per 24 hour  Intake              900 ml  Output             1000 ml  Net             -100 ml   LBM:   Baseline Weight: Weight: 70.3 kg (155 lb) Most recent weight: Weight: 70.3 kg (155 lb)  Palliative Assessment/Data: PPS 30%   Flowsheet Rows     Most Recent Value  Intake Tab  Referral Department  Hospitalist  Unit at Time of Referral  Intermediate Care Unit  Palliative Care Primary Diagnosis  Pulmonary  Date Notified  11/29/2016  Palliative Care Type  New Palliative care  Reason for referral  Clarify Goals of Care  Date of Admission  11/24/2016  Date first seen by Palliative Care  12/07/16  # of days Palliative referral response time  1 Day(s)  # of days IP prior to Palliative referral  0  Clinical Assessment  Palliative Performance Scale Score  30%  Psychosocial & Spiritual Assessment  Palliative Care Outcomes  Patient/Family meeting held?  Yes  Who was at the meeting?  patient, wife, sister    Palliative Care Outcomes  Clarified goals of care, Counseled regarding hospice, ACP counseling assistance, Provided psychosocial or spiritual support      Patient Active Problem List   Diagnosis Date Noted  . At high risk for aspiration   . Hypokalemia   . Moderate protein-calorie malnutrition ([place])   . Dysphagia   . Palliative care by specialist   . DNR (do not resuscitate) discussion   . SOB (shortness of breath)   . Acute neuromuscular respiratory failure ([place]) 11/25/2016  . 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 MS (multiple sclerosis) ([place])   . Aspiration into airway 11/27/2016  . Choking   . Muscular weakness   . Respiratory failure, chronic neuromuscular ([place]) 09/14/2016  . 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 Muscular dystrophy, limb girdle ([place]) 08/19/2016    Palliative Care Assessment & Plan   Patient Profile: 61 y.o. male  with past medical history of limb-girdle muscular dystrophy, PVD, PE on xarelto, depression, viral hepatitis C, and dysphagia admitted on 12/08/2016 with shortness of breath. Recent hospitalization from 6/16-6/19 for acute respiratory failure due to poor secretion clearance. Discharged to SNF with orders for HS BiPAP but there was complications with BiPAP at the facility. In ED, patient placed on BiPAP. Patient with acute on chronic respiratory failure in the setting of progressive muscular dystrophy. PRN and HS BiPAP. Receiving chest physical therapy, nebs, flonase, robitussin, and robinul. Per PCCM, may need elective PEG placement. Palliative medicine consultation for goals of care.   Assessment: Acute respiratory failure due to mucus plugging Limb-girdle muscular dystrophy Hypokalemia Dysphagia High risk for aspiration Protein-calorie malnutrition  Recommendations/Plan:  PEG placement today.  Remains FULL code. Wife plans to continue conversations with him regarding code status and EOL  wishes as disease progresses.   PMT will continue to support patient/family through hospitalization.   Goals of Care and Additional Recommendations:  Limitations on Scope of Treatment: Full Scope Treatment  Code Status: FULL   Code Status Orders        Start     Ordered   11/14/2016 1908  Full code  Continuous     12/01/2016 1907    Code Status History    Date Active Date Inactive Code Status Order ID Comments User Context   11/27/2016  4:51 PM 11/30/2016  7:32 PM Full Code [id number]  Rogue Bussing, MD ED       Prognosis:   Unable to determine: guarded with high risk for aspiration, decompensation requiring intubation/mechanical ventilation, decreased functional and nutritional status, and underlying progressive muscular dystrophy.   Discharge Planning:  To Be Determined:   Care plan was discussed with patient, wife, sisters  Thank you for allowing the Palliative Medicine Team to assist in the care of this patient.   Time In: 1205 Time Out: 1245 Total Time 27mn Prolonged Time Billed  no      Greater than 50%  of this time was spent counseling and coordinating care related to the above assessment and plan.  MIhor Dow FNP-C Palliative Medicine Team  Phone: [phone]: [phone] Please contact Palliative Medicine Team phone at [phone] questions and concerns.

## 2016-12-09 NOTE — Progress Notes (Signed)
SLP Cancellation Note  Patient Details Name: George Evans MRN: [id number] DOB: [birth year]   Cancelled treatment:       Reason Eval/Treat Not Completed: Medical issues which prohibited therapy - per chart, pt appears to be pending PEG placement today. Will f/u as able.   Maxcine Ham 12/09/2016, 1:17 PM  Maxcine Ham, M.A. CCC-SLP [phone]

## 2016-12-09 NOTE — Plan of Care (Signed)
Problem: Health Behavior/Discharge Planning: Goal: Ability to manage health-related needs will improve Outcome: Not Progressing Patient unable to care for slef, but able to make decisions concerning care  Problem: Skin Integrity: Goal: Risk for impaired skin integrity will decrease Outcome: Not Progressing Patient not moving in bed or turning every 2 hours. Patient states he is comfortable on his back.   Problem: Nutrition: Goal: Adequate nutrition will be maintained Outcome: Not Progressing PEG scheduled for Am ,but the paitent and family are not sure if they want to proceed. Will speak to MD in am.

## 2016-12-09 NOTE — Progress Notes (Signed)
Responded to [facility] Consult to assist with AD.  Patient preparing for surgery. Patient wife took AD form and want to review it and will call chaplain services when ready.   12/09/16 1356  Clinical Encounter Type  Visited With Patient and family together;Health care provider  Visit Type Initial;Spiritual support  Referral From Nurse  Spiritual Encounters  Spiritual Needs Literature;Prayer;Emotional  Stress Factors  Patient Stress Factors None identified  Family Stress Factors Health changes  Advance Directives (For Healthcare)  Does Patient Have a Medical Advance Directive? No  Does patient want to make changes to medical advance directive? Yes (Inpatient - patient defers changing a medical advance directive at this time)  Fae Pippin, [facility], Pager [phone]

## 2016-12-09 NOTE — [place] FL2 (Signed)
[place] MEDICAID FL2 LEVEL OF CARE SCREENING TOOL     IDENTIFICATION  Patient Name: George Evans Birthdate: 1955-08-29 Sex: male Admission Date (Current Location): 2016-12-21  [facility] and [place] Number:  Producer, television/film/video and Address:  [facility] [place], [address], [place], [place] [address]      Provider Number: [id number]  Attending Physician Name and Address:  Carney Living, MD  Relative Name and Phone Number:       Current Level of Care: Hospital Recommended Level of Care: Skilled Nursing Facility Prior Approval Number:    Date Approved/Denied:   PASRR Number:    Discharge Plan: SNF    Current Diagnoses: Patient Active Problem List   Diagnosis Date Noted  . At high risk for aspiration   . Hypokalemia   . Moderate protein-calorie malnutrition (HCC)   . Dysphagia   . Palliative care by specialist   . Goals of care, counseling/discussion   . SOB (shortness of breath)   . Acute neuromuscular respiratory failure (HCC) Dec 21, 2016  . 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 MS (multiple sclerosis) (HCC)   . Aspiration into airway 11/27/2016  . Choking   . Muscular weakness   . Respiratory failure, chronic neuromuscular (HCC) 09/14/2016  . 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    Orientation RESPIRATION BLADDER Height & Weight     Self, Time, Situation, Place  O2, Other (Comment) (bipap at night) Incontinent, External catheter Weight: 155 lb (70.3 kg) Height:  6' (182.9 cm)  BEHAVIORAL SYMPTOMS/MOOD NEUROLOGICAL BOWEL NUTRITION STATUS      Continent Feeding tube (PEG placed 6/28)  AMBULATORY STATUS COMMUNICATION OF NEEDS Skin   Total Care Verbally Normal                       Personal Care Assistance Level of Assistance  Bathing, Dressing, Total care Bathing Assistance: Maximum assistance   Dressing Assistance: Maximum  assistance Total Care Assistance: Maximum assistance   Functional Limitations Info             SPECIAL CARE FACTORS FREQUENCY                       Contractures      Additional Factors Info  Code Status, Allergies Code Status Info: FULL Allergies Info: coconut oil           Current Medications (12/09/2016):  This is the current hospital active medication list Current Facility-Administered Medications  Medication Dose Route Frequency Provider Last Rate Last Dose  . albuterol (PROVENTIL) (2.5 MG/3ML) 0.083% nebulizer solution 2.5 mg  2.5 mg Nebulization BID AC & HS Duayne Cal, NP   2.5 mg at 12/09/16 0744  . ceFAZolin (ANCEF) IVPB 2g/100 mL premix  2 g Intravenous to Berton Lan, Tresa Endo, PA-C      . enoxaparin (LOVENOX) injection 70 mg  1 mg/kg Subcutaneous Q12H Almon Hercules, RPH   70 mg at 12/08/16 2159  . fluticasone (FLONASE) 50 MCG/ACT nasal spray 2 spray  2 spray Each Nare Daily Duayne Cal, NP   2 spray at 12/08/16 1000  . guaiFENesin (ROBITUSSIN) 100 MG/5ML solution 100 mg  5 mL Oral Q4H PRN Duayne Cal, NP      . senna-docusate (Senokot-S) tablet 1 tablet  1 tablet Oral QHS Jeneen Rinks J, DO   1 tablet at 12-21-16  2146  . sodium chloride 0.9 % 1,000 mL with potassium chloride 40 mEq infusion   Intravenous Continuous Leland Her, DO 125 mL/hr at 12/09/16 1419       Discharge Medications: Please see discharge summary for a list of discharge medications.  Relevant Imaging Results:  Relevant Lab Results:   Additional Information SS#: [id number]  Burna Sis, LCSW

## 2016-12-09 NOTE — Procedures (Signed)
Pre procedure Dx: Dysphagia Post Procedure Dx: Same  Successful fluoroscopic guided insertion of gastrostomy tube.   The gastrostomy tube may be used immediately for medications.   Tube feeds may be initiated in 24 hours as per the primary team.    EBL: Minimal  Complications: None immediate  Jay Rayshad Riviello, MD Pager #: [phone]    

## 2016-12-09 NOTE — Care Management Note (Addendum)
Case Management Note  Patient Details  Name: George Evans MRN: [id number] Date of Birth: October 10, 1955  Subjective/Objective:         Pt admitted with increasing inability to handle secretions - from [facility]           Action/Plan:  PTA from [name] - per CCM pt will need either triology  Vent (preferred) or Non invasive ventilation greater than BIPAP at night at facility.  CSW consulted and informed of PCCM request - CSW actively seeking placement of facility that can provide this equipment.     Expected Discharge Date:                  Expected Discharge Plan:  [place] (From [facility])  In-House Referral:  Clinical Social Work  Discharge planning Services  CM Consult  Post Acute Care Choice:    Choice offered to:     DME Arranged:    DME Agency:     HH Arranged:    HH Agency:     Status of Service:     If discussed at [employer], dates discussed:    Additional Comments: 12/09/2016  CSW and CM met with wife and pt at bedside and explained barriers regarding placement with ventilator requirements.   Concerns were raised with defective BIPAP at [facility] following up to ensure properly working equipment. Both wife and pt are adamant regarding being placed local.  Wife and pt wish to return to [name] on BIPAP.   Per attending the plan is; pt is get PEG today in IR and then discharge back to SNF with BIPAP.  Per attending pt needs either triology or trach and noctural ventilation.  Complex case - if pt is to discharge back to SNF with any needs of ventilation (including trilogy) will likely need to be placed out of state.  Per attending pt is not appropriate at this time for [facility] and would not pursue LTACH referral until post trach and weaning attempts are made -  CM also feels that pt is not yet appropriate for [facility] referral and communicated that if at some point  [facility] referral is deemed appropriate but denied by insurance pt would  again likely be placed out of state.  LTACH liaison consulted regarding LTACH approval via insurance - at this point is unknown as it would depend on weaning and acuity.  Pt has stated that under no circumstances does he want to go out of state and has therefore elected to go back to SNF with BIPAP.  CSW actively working on case for possible discharge options in state  Maryclare Labrador, [place] 12/09/2016, 10:46 AM

## 2016-12-09 NOTE — Progress Notes (Signed)
Family Medicine Teaching Service Daily Progress Note Intern Pager: [phone]  Patient name: George Evans Medical record number: [id number] Date of birth: 30-Dec-1955 Age: 60 y.o. Gender: male  Primary Care Provider: York Spaniel, MD Consultants: CCM Code Status: Full  Pt Overview and Major Events to Date:  6/25- admitted to FPTS with acute respiratory failure  Assessment and Plan: George Evans is a 61 y.o. male presenting with SOB . PMH is significant for limb-girdle muscular dystrophy, multiple PEs, and previously treated hepatitis C.   #Acute respiratory failure 2/2 mucus plugging, improved Patient did not get Bipap overnight, seems to be doing better from a respiratory standpoint. Patient unable to have the trilogy ventilator in state and would have to go out of state which is not an option for the patient. Tracheostomy was also discussed and patient have opted to continue with Bipap and will return to SNF with higher quality care. Follows with Pulmonologist Dr. Isaiah Serge. CCM following, appreciate recommendations --BiPAP nightly and prn during the day for IWOB (i.e. RR>35). Should not use BiPAP for >2 hours at a time. --Continue Robinul 0.5mg  tid --Chest PT via vest, suctioning, mucomyst nebs BID --Continue guaifenesin 100 mg q4 prn   --Continue Flonase 50 mcg, 2 spray each nare --Albuterol nebs bid --NIFs q12hrs  #Hypokalemia K 3.4 on admission, initially patient could not tolerate IV K due to burning. This morning K+ is 4.4. --Follow up on BMP --Replete as needed  #Limb-girdle muscular dystrophy, chronic, worsening Follows with Neurologist Dr. Anne Hahn. Progressive weakness resulting in swallowing and breathing difficulties at this time. Last BM was Thursday, on senna at SNF, but has not been eating much d/t difficulty with swallowing. Patient will have g-tube placed by IR and CT abdomen showed normal anatomy. --NPO for g-tube placement  #Protein-Calorie Malnutrition Had  modified barium swallow 6/26 demonstrating aspiration, recommended NPO and sips of water for dry mouth. Patient asked for clear liquid diet and acknowledged risk of aspiration. Patient is scheduled for g-tube placement by IR.  --NPO for procedure --Will continue to monitor --Will resume diet after procedure  #Tachycardia, resolved  Initially tachycardic on admission, likely secondary to anxiety for respiratory distress and albuterol treatment. Has resolved, with HR within normal limit. 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 - Xarelto held, lovenox per pharmacy  FEN/GI: NOP for g-tube placement Prophylaxis: hold xarelto while NPO, lovenox per pharm  Disposition: pending clinical improvement, back to ALF  Subjective:  Family was upset this morning because patient did not get Bipap overnight. In addition, they did not know why patient was getting an abdominal CT. Had a long discussion about g-tube and Bipap, and family have a better understanding of plan going forward   Objective: Temp:  [97.7 F (36.5 C)-98.4 F (36.9 C)] 97.7 F (36.5 C) (06/28 0508) Pulse Rate:  [80-110] 87 (06/28 0508) Resp:  [13-23] 17 (06/28 0508) BP: (102-111)/(68-79) 103/75 (06/28 0508) SpO2:  [96 %-100 %] 99 % (06/28 0508)   Physical Exam: General: thin chronically ill appearing man sitting up in bed, [place] in place, in NAD Cardiovascular: tachycardic, regular rhythm. No MRG Respiratory: coarse breath sounds bilaterally with decreased air movement Abdomen: soft, NTND Extremities: thin. Decreased tone in LE.   Laboratory:  Recent Labs Lab 2017/01/05 1715 12/09/16 0533  WBC 8.6 4.5  HGB 16.6 12.8*  HCT 50.6 40.6  PLT 298 210    Recent Labs Lab 01-05-2017 1930  12/08/16 1030 12/08/16 1529 12/09/16 0533  NA  --   < >  144 143 144  K  --   < > 2.4* 3.6 4.4  CL  --   < > 110 110 111  CO2  --   < > 27 29 25   BUN  --   < > <5* <5* <5*  CREATININE  --   < > <0.30*  <0.30* <0.30*  CALCIUM  --   < > 9.1 8.9 9.1  PROT 7.7  --   --   --   --   BILITOT 1.2  --   --   --   --   ALKPHOS 68  --   --   --   --   ALT 23  --   --   --   --   AST 22  --   --   --   --   GLUCOSE  --   < > 87 92 73  < > = values in this interval not displayed.  Imaging/Diagnostic Tests: Ct Abdomen Wo Contrast  Result Date: 12/08/2016 CLINICAL DATA:  61 year old male with dysphagia EXAM: CT ABDOMEN WITHOUT CONTRAST TECHNIQUE: Multidetector CT imaging of the abdomen was performed following the standard protocol without IV contrast. COMPARISON:  None. FINDINGS: Lower chest: Chronic elevation of the left hemidiaphragm with associated atelectasis and bronchiolectasis. Trace dependent atelectasis in the right lower lobe. No focal airspace consolidation. The heart is normal in size. No pericardial effusion. Unremarkable distal thoracic esophagus. Hepatobiliary: Normal hepatic contour and morphology. No discrete hepatic lesions. Normal appearance of the gallbladder. No intra or extrahepatic biliary ductal dilatation. Pancreas: Unremarkable. No pancreatic ductal dilatation or surrounding inflammatory changes. Spleen: Normal in size without focal abnormality. Adrenals/Urinary Tract: Normal adrenal glands bilaterally. At least 3 focal radio opacities are present within the right renal collecting system consistent with nonobstructing nephrolithiasis. The largest stone measures 4 mm. No definite nephrolithiasis on the left. No evidence of hydronephrosis. Stomach/Bowel: High density contrast is present within the colon, likely related to the patient's recent modified barium swallow. The stomach and visualized small bowel are unremarkable. No focal bowel wall thickening or evidence of obstruction. No free fluid. Vascular/Lymphatic: Very mild atherosclerotic calcifications in the abdominal aorta. No evidence of aneurysm. No suspicious adenopathy. Other: Subcutaneous emphysema in the left lower quadrant  anterior abdominal wall likely related to subcutaneous injections. Musculoskeletal: Severe fatty atrophy of all visible muscles. No acute osseous abnormality. IMPRESSION: 1. Nonobstructing right-sided nephrolithiasis. 2. Chronic elevation of the left hemidiaphragm with associated left lower lobe atelectasis and bronchiolectasis 3. The recently ingested barium contrast is identified within the colon. Unremarkable imaged stomach and small bowel. 4.  Aortic Atherosclerosis (ICD10-170.0) 5. Severe fatty atrophy of all visible muscle belly is consistent with the clinical history of muscular dystrophy. Electronically Signed   By: Malachy Moan M.D.   On: 12/08/2016 19:14    Lovena Neighbours, MD 12/09/2016, 7:11 AM PGY-1, [place] [facility] FPTS Intern pager: [phone], text pages welcome

## 2016-12-09 NOTE — Sedation Documentation (Signed)
States pain at G tube site has "eased up"

## 2016-12-09 NOTE — Sedation Documentation (Signed)
Patient is resting comfortably. 

## 2016-12-09 NOTE — Telephone Encounter (Signed)
Pt is currently in the hospital.  PM do you still want to try and set the pt up with PT and percussion vest?  Thanks

## 2016-12-09 NOTE — Progress Notes (Signed)
RT did not do CPT on patient. Patient stated he could not handle CPT at this time due to being sore from procedure.

## 2016-12-09 NOTE — Progress Notes (Signed)
   Name: George Evans MRN: [id number] DOB: 04-Dec-1955    ADMISSION DATE:  12/14/2016 CONSULTATION DATE:  6/25   REFERRING MD :  Dr. Lujean Rave    CHIEF COMPLAINT:  Dyspnea  HISTORY OF PRESENT ILLNESS:   61 yo with a variant of limb-girdle muscular dystrophy and increasing dysphagia and difficulty clearing secretions. He was recently admitted for 3 days for aspiration episode, discharged to [facility] place on nocturnal BiPAP- He again developed acute onset dyspnea 6/25 with difficulty clearing secretions, has improved    PMH -Limb-girdle muscular dystrophy, dysphagia, PE on lifelong Xarelto, and hepatitis C (treated).   SIGNIFICANT EVENTS  6/16 > 6/19 admit for respiratory failure and ? Pill aspiration. 6/25 > admit with dyspnea  STUDIES:  Admit CXR > L hemidiaphragmatic elevation and associated ATX  SUBJECTIVE:    Doing better with secretions & breathing. Anxiety is lesser. Was not offered BiPAP last night    VITAL SIGNS: Temp:  [97.7 F (36.5 C)-98.4 F (36.9 C)] 97.7 F (36.5 C) (06/28 0508) Pulse Rate:  [73-110] 103 (06/28 0745) Resp:  [12-23] 19 (06/28 0745) BP: (103-110)/(70-79) 103/75 (06/28 0508) SpO2:  [96 %-100 %] 99 % (06/28 0745)  PHYSICAL EXAMINATION: General:  Frail adult male in NAD Neuro:  Alert, oriented, non-focal, weakness 4/5 all 4 extremities HEENT:  [place]/AT, PERRL, no JVD Cardiovascular:  RRR, no MRG Lungs:  Some rhonchi, improved. No distress, able to suction himself Abdomen:  Soft, non-tender, non-distended Musculoskeletal:  No acute deformity Skin:  Grossly intact   Recent Labs Lab 12/08/16 1030 12/08/16 1529 12/09/16 0533  NA 144 143 144  K 2.4* 3.6 4.4  CL 110 110 111  CO2 27 29 25   BUN <5* <5* <5*  CREATININE <0.30* <0.30* <0.30*  GLUCOSE 87 92 73    Recent Labs Lab 2016-12-14 1715 12/09/16 0533  HGB 16.6 12.8*  HCT 50.6 40.6  WBC 8.6 4.5  PLT 298 210     ASSESSMENT / PLAN:  Acute on chronic respiratory  failure:with hypoventilation in the setting of progressive muscular dystrophy complicated by impaired clearance of respiratory secretions.  PLAN -  Continue mucinex,chest PT and suctioning Mandatory qhs bipap  Dc mucomyst Robinul discontinued   Difficult situation -ideally he is at the point where he needs tracheostomy for secretion management and nocturnal ventilation. However placement would be difficult after this and he certainly does not want to be placed out of state. Alternative would be noninvasive ventilation but I placement becomes an issue. Only option available may be nocturnal BiPAP and return to [facility] place  Protein calorie malnutrition-PEG planned for today  Discussed above in detail with patient, his wife George Evans and family practice attending  Cyril Mourning MD. FCCP. [place] [facility] Pager [phone] If no response call [phone]   12/09/2016  12:47 PM

## 2016-12-09 NOTE — Consult Note (Signed)
Chief Complaint: dysphagia  Referring Physician:Dr. Kara Mead  Supervising Physician: Sandi Mariscal  Patient Status: [facility] - In-pt  HPI: [facility] is a 61 y.o. 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  He has been admitted secondary to acute on chronic respiratory and felt to need a trach, but the wife is not wanting to do this yet as placement would be an issue when needing nighttime ventilation.  The patient also has progressive dysphagia and has some aspiration issues.  He is very malnourished and unable to essentially eat much of anything.  We have been asked to place a g-tube to assist with nutrition.  Past Medical History:  Past Medical History:  Diagnosis Date  . Abnormal posture   . Chronic viral hepatitis C ([place])   . Constipation   . Difficulty walking   . 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 Major depressive disorder   . Muscle wasting   . Muscle weakness   . Other diseases of bronchus, not elsewhere classified (CODE)   . Peripheral vascular disease ([place])   . Pressure ulcer of sacral region     Past Surgical History:  Past Surgical History:  Procedure Laterality Date  . APPENDECTOMY  1992  . OTHER SURGICAL HISTORY  2016   Abcess removed from penis  . ROTATOR CUFF REPAIR Right 1989    Family History:  Family History  Problem Relation Age of Onset  . Diabetes Mother   . Cancer Sister        thyroid  . Cancer Sister        thyroid    Social History:  reports that he quit smoking about 7 years ago. He has a 80.00 pack-year smoking history. He has never used smokeless tobacco. He reports that he does not drink alcohol or use drugs.  Allergies:  Allergies  Allergen Reactions  . Coconut Oil Swelling    Medications: Medications reviewed in epic  Please HPI for pertinent positives, otherwise complete 10 system ROS negative, bed or wheelchair bound secondary  to muscle atrophy.  Mallampati Score: MD Evaluation Airway: WNL Heart: WNL Abdomen: WNL Chest/ Lungs: WNL ASA  Classification: 3 Mallampati/Airway Score: One  Physical Exam: BP 111/76 (BP Location: Left Arm)   Pulse 92   Temp 97.6 F (36.4 C) (Axillary)   Resp (!) 21   Ht 6' (1.829 m)   Wt 155 lb (70.3 kg)   SpO2 98%   BMI 21.02 kg/m  Body mass index is 21.02 kg/m. General: pleasant white male who is laying in bed in NAD HEENT: head is normocephalic, atraumatic.  Sclera are noninjected.  PERRL.  Ears and nose without any masses or lesions.  Mouth is pink Heart: regular, rate, and rhythm.  Normal s1,s2. No obvious murmurs, gallops, or rubs noted.  Palpable radial pulses bilaterally Lungs: CTAB, no wheezes, rhonchi, or rales noted.  Respiratory effort nonlabored Abd: soft, NT, ND, +BS, no masses, hernias, or organomegaly MS: all 4 extremities are symmetrical with muscle wasting Psych: A&Ox3 with an appropriate affect.   Labs: Results for orders placed or performed during the hospital encounter of 11/14/2016 (from the past 48 hour(s))  Magnesium     Status: Abnormal   Collection Time: 12/08/16  2:55 AM  Result Value Ref Range   Magnesium 1.5 (L) 1.7 - 2.4 mg/dL  Basic metabolic panel     Status: Abnormal   Collection Time:  12/08/16  2:55 AM  Result Value Ref Range   Sodium 144 135 - 145 mmol/L   Potassium <2.0 (LL) 3.5 - 5.1 mmol/L    Comment: DELTA CHECK NOTED REPEATED TO VERIFY CRITICAL RESULT CALLED TO, READ BACK BY AND VERIFIED WITH: CHRATLIWY C,RN 12/08/16 0421 WAYK    Chloride 111 101 - 111 mmol/L   CO2 28 22 - 32 mmol/L   Glucose, Bld 138 (H) 65 - 99 mg/dL   BUN <5 (L) 6 - 20 mg/dL   Creatinine, Ser <0.30 (L) 0.61 - 1.24 mg/dL   Calcium 9.2 8.9 - 10.3 mg/dL   GFR calc non Af Amer NOT CALCULATED >60 mL/min   GFR calc Af Amer NOT CALCULATED >60 mL/min    Comment: (NOTE) The eGFR has been calculated using the CKD EPI equation. This calculation has not been  validated in all clinical situations. eGFR's persistently <60 mL/min signify possible Chronic Kidney Disease.    Anion gap 5 5 - 15  Basic metabolic panel     Status: Abnormal   Collection Time: 12/08/16 10:30 AM  Result Value Ref Range   Sodium 144 135 - 145 mmol/L   Potassium 2.4 (LL) 3.5 - 5.1 mmol/L    Comment: CRITICAL RESULT CALLED TO, READ BACK BY AND VERIFIED WITH: E. COLLERAN,RN AT 9458 12/08/16 BY ZBEECH.    Chloride 110 101 - 111 mmol/L   CO2 27 22 - 32 mmol/L   Glucose, Bld 87 65 - 99 mg/dL   BUN <5 (L) 6 - 20 mg/dL   Creatinine, Ser <0.30 (L) 0.61 - 1.24 mg/dL   Calcium 9.1 8.9 - 10.3 mg/dL   GFR calc non Af Amer NOT CALCULATED >60 mL/min   GFR calc Af Amer NOT CALCULATED >60 mL/min    Comment: (NOTE) The eGFR has been calculated using the CKD EPI equation. This calculation has not been validated in all clinical situations. eGFR's persistently <60 mL/min signify possible Chronic Kidney Disease.    Anion gap 7 5 - 15  Basic metabolic panel     Status: Abnormal   Collection Time: 12/08/16  3:29 PM  Result Value Ref Range   Sodium 143 135 - 145 mmol/L   Potassium 3.6 3.5 - 5.1 mmol/L    Comment: HEMOLYSIS AT THIS LEVEL MAY AFFECT RESULT DELTA CHECK NOTED    Chloride 110 101 - 111 mmol/L   CO2 29 22 - 32 mmol/L   Glucose, Bld 92 65 - 99 mg/dL   BUN <5 (L) 6 - 20 mg/dL   Creatinine, Ser <0.30 (L) 0.61 - 1.24 mg/dL   Calcium 8.9 8.9 - 10.3 mg/dL   GFR calc non Af Amer NOT CALCULATED >60 mL/min   GFR calc Af Amer NOT CALCULATED >60 mL/min    Comment: (NOTE) The eGFR has been calculated using the CKD EPI equation. This calculation has not been validated in all clinical situations. eGFR's persistently <60 mL/min signify possible Chronic Kidney Disease.    Anion gap 4 (L) 5 - 15  CBC     Status: Abnormal   Collection Time: 12/09/16  5:33 AM  Result Value Ref Range   WBC 4.5 4.0 - 10.5 K/uL   RBC 4.62 4.22 - 5.81 MIL/uL   Hemoglobin 12.8 (L) 13.0 - 17.0 g/dL     HCT 40.6 39.0 - 52.0 %   MCV 87.9 78.0 - 100.0 fL   MCH 27.7 26.0 - 34.0 pg   MCHC 31.5 30.0 - 36.0 g/dL  RDW 17.1 (H) 11.5 - 15.5 %   Platelets 210 150 - 400 K/uL  Basic metabolic panel     Status: Abnormal   Collection Time: 12/09/16  5:33 AM  Result Value Ref Range   Sodium 144 135 - 145 mmol/L   Potassium 4.4 3.5 - 5.1 mmol/L    Comment: DELTA CHECK NOTED   Chloride 111 101 - 111 mmol/L   CO2 25 22 - 32 mmol/L   Glucose, Bld 73 65 - 99 mg/dL   BUN <5 (L) 6 - 20 mg/dL   Creatinine, Ser <0.30 (L) 0.61 - 1.24 mg/dL   Calcium 9.1 8.9 - 10.3 mg/dL   GFR calc non Af Amer NOT CALCULATED >60 mL/min   GFR calc Af Amer NOT CALCULATED >60 mL/min    Comment: (NOTE) The eGFR has been calculated using the CKD EPI equation. This calculation has not been validated in all clinical situations. eGFR's persistently <60 mL/min signify possible Chronic Kidney Disease.    Anion gap 8 5 - 15  Protime-INR     Status: None   Collection Time: 12/09/16 11:23 AM  Result Value Ref Range   Prothrombin Time 14.4 11.4 - 15.2 seconds   INR 1.12     Imaging: Ct Abdomen Wo Contrast  Result Date: 12/08/2016 CLINICAL DATA:  61 year old male with dysphagia EXAM: CT ABDOMEN WITHOUT CONTRAST TECHNIQUE: Multidetector CT imaging of the abdomen was performed following the standard protocol without IV contrast. COMPARISON:  None. FINDINGS: Lower chest: Chronic elevation of the left hemidiaphragm with associated atelectasis and bronchiolectasis. Trace dependent atelectasis in the right lower lobe. No focal airspace consolidation. The heart is normal in size. No pericardial effusion. Unremarkable distal thoracic esophagus. Hepatobiliary: Normal hepatic contour and morphology. No discrete hepatic lesions. Normal appearance of the gallbladder. No intra or extrahepatic biliary ductal dilatation. Pancreas: Unremarkable. No pancreatic ductal dilatation or surrounding inflammatory changes. Spleen: Normal in size without  focal abnormality. Adrenals/Urinary Tract: Normal adrenal glands bilaterally. At least 3 focal radio opacities are present within the right renal collecting system consistent with nonobstructing nephrolithiasis. The largest stone measures 4 mm. No definite nephrolithiasis on the left. No evidence of hydronephrosis. Stomach/Bowel: High density contrast is present within the colon, likely related to the patient's recent modified barium swallow. The stomach and visualized small bowel are unremarkable. No focal bowel wall thickening or evidence of obstruction. No free fluid. Vascular/Lymphatic: Very mild atherosclerotic calcifications in the abdominal aorta. No evidence of aneurysm. No suspicious adenopathy. Other: Subcutaneous emphysema in the left lower quadrant anterior abdominal wall likely related to subcutaneous injections. Musculoskeletal: Severe fatty atrophy of all visible muscles. No acute osseous abnormality. IMPRESSION: 1. Nonobstructing right-sided nephrolithiasis. 2. Chronic elevation of the left hemidiaphragm with associated left lower lobe atelectasis and bronchiolectasis 3. The recently ingested barium contrast is identified within the colon. Unremarkable imaged stomach and small bowel. 4.  Aortic Atherosclerosis (ICD10-170.0) 5. Severe fatty atrophy of all visible muscle belly is consistent with the clinical history of muscular dystrophy. Electronically Signed   By: Jacqulynn Cadet M.D.   On: 12/08/2016 19:14   Dg Swallowing Func-speech Pathology  Result Date: 12/07/2016 Objective Swallowing Evaluation: Type of Study: MBS-Modified Barium Swallow Study Patient Details Name: [facility] MRN: [id number] Date of Birth: 12-15-1955 Today's Date: 12/07/2016 Time: SLP Start Time (ACUTE ONLY): 1340-SLP Stop Time (ACUTE ONLY): 1420 SLP Time Calculation (min) (ACUTE ONLY): 40 min Past Medical History: Past Medical History: Diagnosis Date . Abnormal posture  . Chronic viral hepatitis C (  [facility])  . Constipation  .  Difficulty walking  . 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 Major depressive disorder  . Muscle wasting  . Muscle weakness  . Other diseases of bronchus, not elsewhere classified (CODE)  . Peripheral vascular disease ([place])  . Pressure ulcer of sacral region  Past Surgical History: Past Surgical History: Procedure Laterality Date . APPENDECTOMY  1992 . OTHER SURGICAL HISTORY  2016  Abcess removed from penis . ROTATOR CUFF REPAIR Right 1989 HPI: Pt is a 61 y.o. male with PMH of limb-girdle muscular dystrophy, multiple PEs, and previously treated hepatitis C, presenting to ED on 6/25 with SOB and mucus plugging/ inability to handle secretions, intermittently requiring BiPAP in ED. Pt reportedly not eating much due to difficulty swallowing. Pt previously seen on recent admission after concern for pill aspiration (11/28/16). A MBS was completed March 2018 at outside facility- report unavailable but per SLP note 11/28/16 pt reported being told to have small bites/ sips and that there was a high aspiration risk although aspiration not observed during study. During previous evaluation recommendation was for a dysphagia 3 diet, thin liquids, meds crushed in puree along with compensatory strategies- multiple swallows, follow solids with liquid, effortful swallow. Bedside swallow eval ordered to re-evaluate.  Subjective: pt alert, says his swallowing feels unchanged since prior MBS, has particularly things he will or will not eat Assessment / Plan / Recommendation CHL IP CLINICAL IMPRESSIONS 12/07/2016 Clinical Impression Pt currently with a severe pharyngeal phase/ cervical esophageal phase, motor-based dysphagia. Pharyngeal phase characterized by significantly reduced epiglottic inversion/ pharyngeal peristalsis and reduced hyolaryngeal excursion resulting in reduced airway closure and aspiration of thin liquids, both during the swallow and then after the swallow due to residuals. Pt  with very weak cough and unable to clear aspirated material. Very small amounts (25% at most) of the bolus actually appeared to pass through the UES. At the cervical esophageal level, there is an appearance of a prominent UES and reduced UES opening, also impacting swallow function, along with poor head/ trunk control which at times appeared to push bolus forward toward airway. Significant residuals in the pharyngeal spaces post-swallow were minimally cleared on subsequent swallows. Numerous strategies attempted; the Mendelsohn maneuver was most effective in clearing bolus past the UES; however pt continued to have significant pharyngeal residuals. Given these findings, pt is at a severe risk of aspiration and inadequate nutrition. Pt reported having discussion with MD regarding possible PEG placement and that this is under consideration. Will defer diet decision to MD; safest diet if pursued would be thin liquid only with strategies in place but pt would still be at severe risk of aspiration. Pt would be a great candidate for a free water protocol following thorough oral care for comfort. Pt may also have some short-term benefit from pharyngeal swallow exercises although prognosis for significant improvement is guarded. Will continue to follow for education/ review of compensatory strategies/ exercises if a diet or free water protocol is desired.  SLP Visit Diagnosis Dysphagia, pharyngoesophageal phase (R13.14) Attention and concentration deficit following -- Frontal lobe and executive function deficit following -- Impact on safety and function Severe aspiration risk;Risk for inadequate nutrition/hydration   CHL IP TREATMENT RECOMMENDATION 12/07/2016 Treatment Recommendations Therapy as outlined in treatment plan below   Prognosis 12/07/2016 Prognosis for Safe Diet Advancement Guarded Barriers to Reach Goals Severity of deficits Barriers/Prognosis Comment -- CHL IP DIET RECOMMENDATION 12/07/2016 SLP Diet  Recommendations NPO;Alternative means - long-term;Free water  protocol after oral care Liquid Administration via Straw Medication Administration Via alternative means Compensations Slow rate;Small sips/bites;Multiple dry swallows after each bite/sip;Other (Comment)- Mendelsohn maneuver Postural Changes Remain semi-upright after after feeds/meals (Comment);Seated upright at 90 degrees   CHL IP OTHER RECOMMENDATIONS 12/07/2016 Recommended Consults -- Oral Care Recommendations Oral care QID Other Recommendations Have oral suction available   CHL IP FOLLOW UP RECOMMENDATIONS 12/07/2016 Follow up Recommendations 24 hour supervision/assistance   CHL IP FREQUENCY AND DURATION 12/07/2016 Speech Therapy Frequency (ACUTE ONLY) min 2x/week Treatment Duration 1 week      CHL IP ORAL PHASE 12/07/2016 Oral Phase WFL Oral - Pudding Teaspoon -- Oral - Pudding Cup -- Oral - Honey Teaspoon -- Oral - Honey Cup -- Oral - Nectar Teaspoon -- Oral - Nectar Cup -- Oral - Nectar Straw -- Oral - Thin Teaspoon -- Oral - Thin Cup -- Oral - Thin Straw -- Oral - Puree -- Oral - Mech Soft -- Oral - Regular -- Oral - Multi-Consistency -- Oral - Pill -- Oral Phase - Comment --  CHL IP PHARYNGEAL PHASE 12/07/2016 Pharyngeal Phase Impaired Pharyngeal- Pudding Teaspoon -- Pharyngeal -- Pharyngeal- Pudding Cup -- Pharyngeal -- Pharyngeal- Honey Teaspoon -- Pharyngeal -- Pharyngeal- Honey Cup -- Pharyngeal -- Pharyngeal- Nectar Teaspoon -- Pharyngeal -- Pharyngeal- Nectar Cup -- Pharyngeal -- Pharyngeal- Nectar Straw -- Pharyngeal -- Pharyngeal- Thin Teaspoon Reduced pharyngeal peristalsis;Reduced epiglottic inversion;Reduced anterior laryngeal mobility;Reduced laryngeal elevation;Reduced airway/laryngeal closure;Penetration/Aspiration during swallow;Penetration/Apiration after swallow;Pharyngeal residue - valleculae;Pharyngeal residue - pyriform;Pharyngeal residue - cp segment;Compensatory strategies attempted (with notebox) Pharyngeal (No Data) Pharyngeal-  Thin Cup -- Pharyngeal -- Pharyngeal- Thin Straw Reduced pharyngeal peristalsis;Reduced epiglottic inversion;Reduced anterior laryngeal mobility;Reduced laryngeal elevation;Reduced airway/laryngeal closure;Penetration/Aspiration during swallow;Penetration/Apiration after swallow;Pharyngeal residue - valleculae;Pharyngeal residue - pyriform;Pharyngeal residue - cp segment;Compensatory strategies attempted (with notebox) Pharyngeal (No Data) Pharyngeal- Puree Reduced pharyngeal peristalsis;Reduced epiglottic inversion;Reduced anterior laryngeal mobility;Reduced laryngeal elevation;Reduced airway/laryngeal closure;Pharyngeal residue - valleculae;Pharyngeal residue - pyriform;Pharyngeal residue - cp segment Pharyngeal -- Pharyngeal- Mechanical Soft -- Pharyngeal -- Pharyngeal- Regular -- Pharyngeal -- Pharyngeal- Multi-consistency -- Pharyngeal -- Pharyngeal- Pill -- Pharyngeal -- Pharyngeal Comment --  CHL IP CERVICAL ESOPHAGEAL PHASE 12/07/2016 Cervical Esophageal Phase Impaired Pudding Teaspoon -- Pudding Cup -- Honey Teaspoon -- Honey Cup -- Nectar Teaspoon -- Nectar Cup -- Nectar Straw -- Thin Teaspoon Reduced cricopharyngeal relaxation;Prominent cricopharyngeal segment Thin Cup -- Thin Straw Reduced cricopharyngeal relaxation;Prominent cricopharyngeal segment Puree Reduced cricopharyngeal relaxation;Prominent cricopharyngeal segment Mechanical Soft -- Regular -- Multi-consistency -- Pill -- Cervical Esophageal Comment -- No flowsheet data found. Amy Dionicia Abler, MA, CCC-SLP 12/07/2016, 2:32 PM [id number]              Assessment/Plan 1. Malnutrition and dysphagia secondary to muscular dystrophy  We will plan to place a g-tube if possible.  If not, we will try to place tomorrow.  His Lovenox has been held.  He is currently NPO. Risks and Benefits discussed with the patient including, but not limited to the need for a barium enema during the procedure, bleeding, infection, peritonitis, or damage to adjacent  structures. All of the patient's questions were answered, patient is agreeable to proceed. Consent signed and in chart.  Thank you for this interesting consult.  I greatly enjoyed meeting [facility] and look forward to participating in their care.  A copy of this report was sent to the requesting provider on this date.  Electronically Signed: Henreitta Cea 12/09/2016, 1:58 PM   I spent a total of 40 Minutes    in face  to face in clinical consultation, greater than 50% of which was counseling/coordinating care for dysphagia

## 2016-12-09 NOTE — Sedation Documentation (Addendum)
O2 2l/Zinc continued post procedure, moved back to bed.

## 2016-12-09 NOTE — Sedation Documentation (Signed)
Dr Grace Isaac in room, procedure explained, questions answered.

## 2016-12-09 NOTE — Progress Notes (Signed)
Initial Nutrition Assessment  DOCUMENTATION CODES:   Not applicable  INTERVENTION:   -Once feeding access is obtained, recommend:  Initiate Jevity 1.2 @ 20 ml/hr via PEG and increase by 10 ml every 4 hours to goal rate of 60 ml/hr.   30 ml Prostat daily.    Tube feeding regimen provides 1828 kcal (100% of needs), 95 grams of protein, and 1162 ml of H2O.   If no IVFS, recommend 100 ml free water flush every 4 hours.  NUTRITION DIAGNOSIS:   Inadequate oral intake related to dysphagia as evidenced by NPO status.  GOAL:   Patient will meet greater than or equal to 90% of their needs  MONITOR:   Labs, Weight trends, Skin, I & O's  REASON FOR ASSESSMENT:   Malnutrition Screening Tool    ASSESSMENT:   George Evans is a 61 y.o. male presenting with SOB . PMH is significant for limb-girdle muscular dystrophy, multiple PEs, and previously treated hepatitis C.   Pt familiar to this RD, due to prior admission.   Pt with flat affect and did not feel like speaking much to this RD. Hx obtained from pt sisters at bedside, who report pt had a recent swallow study, which recommended pt consume only water for comfort at this time. Pt reports he does not feel like he gets choked on liquids (besides water). They confirm intake is very minimal and that pt has been relying on Ensure supplements as his primary source of nutrition.   Per wt hx, wt remains stable.   Nutrition-Focused physical exam completed. Findings are mild fat depletion, mild  muscle depletion, and no edema. Mild depletions noted upper and lower extremities and suspect this is related to muscular dystrophy.   Per discussion with RN, plan is to get PEG either today or tomorrow. Pt will return to SNF at discharge.   Labs reviewed.  Diet Order:  Diet NPO time specified  Skin:  Reviewed, no issues  Last BM:  PTA  Height:   Ht Readings from Last 1 Encounters:  11/12/2016 6' (1.829 m)    Weight:   Wt Readings from  Last 1 Encounters:  11/24/2016 155 lb (70.3 kg)    Ideal Body Weight:  80.9 kg  BMI:  Body mass index is 21.02 kg/m.  Estimated Nutritional Needs:   Kcal:  1750-1950  Protein:  90-105 grams  Fluid:  1.7-1.9 L  EDUCATION NEEDS:   Education needs addressed  Nickson Middlesworth A. Mayford Knife, RD, LDN, CDE Pager: [phone] After hours Pager: [phone]

## 2016-12-09 NOTE — Sedation Documentation (Signed)
Suctioned for oral secretions,

## 2016-12-10 DIAGNOSIS — R0603 Acute respiratory distress: Secondary | ICD-10-CM

## 2016-12-10 LAB — BASIC METABOLIC PANEL
ANION GAP: 8 (ref 5–15)
BUN: <5 mg/dL — ABNORMAL LOW (ref 6–20)
CALCIUM: 8.9 mg/dL (ref 8.9–10.3)
CO2: 23 mmol/L (ref 22–32)
Chloride: 105 mmol/L (ref 101–111)
Creatinine, Ser: <0.3 mg/dL — ABNORMAL LOW (ref 0.61–1.24)
Glucose, Bld: 58 mg/dL — ABNORMAL LOW (ref 65–99)
Potassium: 4.7 mmol/L (ref 3.5–5.1)
Sodium: 136 mmol/L (ref 135–145)

## 2016-12-10 LAB — GLUCOSE, CAPILLARY
GLUCOSE-CAPILLARY: 108 mg/dL — AB (ref 65–99)
GLUCOSE-CAPILLARY: 99 mg/dL (ref 65–99)
Glucose-Capillary: 77 mg/dL (ref 65–99)
Glucose-Capillary: 97 mg/dL (ref 65–99)

## 2016-12-10 LAB — CBC
HCT: 42.7 % (ref 39.0–52.0)
HEMOGLOBIN: 13.6 g/dL (ref 13.0–17.0)
MCH: 28.7 pg (ref 26.0–34.0)
MCHC: 31.9 g/dL (ref 30.0–36.0)
MCV: 90.1 fL (ref 78.0–100.0)
PLATELETS: 208 10*3/uL (ref 150–400)
RBC: 4.74 MIL/uL (ref 4.22–5.81)
RDW: 17.2 % — ABNORMAL HIGH (ref 11.5–15.5)
WBC: 5.1 10*3/uL (ref 4.0–10.5)

## 2016-12-10 MED ORDER — KETOROLAC TROMETHAMINE 15 MG/ML IJ SOLN
15.0000 mg | Freq: Four times a day (QID) | INTRAMUSCULAR | Status: DC | PRN
  Administered 2016-12-10 – 2016-12-11 (×2): 15 mg via INTRAVENOUS
  Filled 2016-12-10 (×3): qty 1

## 2016-12-10 MED ORDER — FENTANYL CITRATE (PF) 100 MCG/2ML IJ SOLN: 100.0000 ug | INTRAMUSCULAR | Status: DC | PRN

## 2016-12-10 MED ORDER — PROPOFOL 500 MG/50ML IV EMUL
5.0000 ug/kg/min | Freq: Once | INTRAVENOUS | Status: DC
  Filled 2016-12-10: qty 50

## 2016-12-10 MED ORDER — JEVITY 1.2 CAL PO LIQD: 1000.0000 mL | ORAL | Status: DC

## 2016-12-10 MED ORDER — MIDAZOLAM HCL 2 MG/2ML IJ SOLN
4.0000 mg | Freq: Once | INTRAMUSCULAR | Status: AC
  Administered 2016-12-11: 2 mg via INTRAVENOUS
  Filled 2016-12-10: qty 4

## 2016-12-10 MED ORDER — PRO-STAT SUGAR FREE PO LIQD: 30.0000 mL | Freq: Every day | ORAL | Status: DC

## 2016-12-10 MED ORDER — ETOMIDATE 2 MG/ML IV SOLN
40.0000 mg | Freq: Once | INTRAVENOUS | Status: AC
  Administered 2016-12-11: 40 mg via INTRAVENOUS

## 2016-12-10 MED ORDER — FENTANYL CITRATE (PF) 100 MCG/2ML IJ SOLN
200.0000 ug | Freq: Once | INTRAMUSCULAR | Status: AC
  Administered 2016-12-11: 100 ug via INTRAVENOUS
  Filled 2016-12-10: qty 4

## 2016-12-10 MED ORDER — MIDAZOLAM HCL 2 MG/2ML IJ SOLN: 2.0000 mg | INTRAMUSCULAR | Status: DC | PRN

## 2016-12-10 MED ORDER — ACETAMINOPHEN 325 MG PO TABS
650.0000 mg | ORAL_TABLET | Freq: Four times a day (QID) | ORAL | Status: DC | PRN
  Administered 2016-12-12 – 2016-12-18 (×3): 650 mg
  Filled 2016-12-10 (×3): qty 2

## 2016-12-10 MED ORDER — OSMOLITE 1.2 CAL PO LIQD
1000.0000 mL | ORAL | Status: DC
  Filled 2016-12-10 (×2): qty 1000

## 2016-12-10 MED ORDER — PROPOFOL 1000 MG/100ML IV EMUL: 0.0000 ug/kg/min | INTRAVENOUS | Status: DC

## 2016-12-10 MED ORDER — OSMOLITE 1.2 CAL PO LIQD: 1000.0000 mL | ORAL | Status: DC

## 2016-12-10 MED ORDER — GUAIFENESIN 100 MG/5ML PO SOLN
5.0000 mL | ORAL | Status: DC | PRN
  Filled 2016-12-10: qty 5

## 2016-12-10 MED ORDER — ALBUTEROL SULFATE (2.5 MG/3ML) 0.083% IN NEBU
2.5000 mg | INHALATION_SOLUTION | RESPIRATORY_TRACT | Status: DC | PRN
Start: 2016-12-10 — End: 2016-12-22
  Administered 2016-12-11 – 2016-12-13 (×3): 2.5 mg via RESPIRATORY_TRACT
  Filled 2016-12-10 (×3): qty 3

## 2016-12-10 MED ORDER — DEXTROSE 50 % IV SOLN
INTRAVENOUS | Status: AC
  Filled 2016-12-10: qty 50

## 2016-12-10 MED ORDER — VECURONIUM BROMIDE 10 MG IV SOLR
10.0000 mg | Freq: Once | INTRAVENOUS | Status: DC
  Filled 2016-12-10: qty 10

## 2016-12-10 MED ORDER — DEXTROSE-NACL 5-0.9 % IV SOLN
INTRAVENOUS | Status: AC
  Administered 2016-12-10: 11:00:00 via INTRAVENOUS

## 2016-12-10 MED ORDER — OSMOLITE 1.2 CAL PO LIQD
1000.0000 mL | ORAL | Status: DC
  Administered 2016-12-10 – 2016-12-18 (×9): 1000 mL
  Filled 2016-12-10 (×11): qty 1000

## 2016-12-10 MED ORDER — DEXTROSE 50 % IV SOLN
25.0000 mL | Freq: Once | INTRAVENOUS | Status: AC
  Administered 2016-12-10: 25 mL via INTRAVENOUS

## 2016-12-10 NOTE — Progress Notes (Signed)
Family Medicine Teaching Service Daily Progress Note Intern Pager: [phone]  Patient name: George Evans Medical record number: [id number] Date of birth: [birth year] Age: 61 y.o. Gender: male  Primary Care Provider: York Spaniel, MD Consultants: CCM Code Status: Full  Pt Overview and Major Events to Date:  6/25- admitted to FPTS with acute respiratory failure  Assessment and Plan: Azul Brumett is a 61 y.o. male presenting with SOB . PMH is significant for limb-girdle muscular dystrophy, multiple PEs, and previously treated hepatitis C.   #Acute respiratory failure 2/2 mucus plugging, improved Patient tolerated Bipap  Patient did not get Bipap overnight, seems to be doing better from a respiratory standpoint. Patient [place] have the trilogy ventilator [place] and would have to go out of state which is not an option for the patient. Tracheostomy was also discussed and patient have opted to continue with Bipap and will return to SNF with higher quality care. Follows with Pulmonologist Dr. Isaiah Serge. CCM following, appreciate recommendations --BiPAP nightly and prn during the day for IWOB (i.e. RR>35). Should not use BiPAP for >2 hours at a time. --Discontinue Robinul, mucomyst nebs --Chest PT via vest, suctioning, BID --Continue guaifenesin 100 mg q4 prn   --Continue Flonase 50 mcg, 2 spray each nare --Albuterol nebs bid --NIFs q12hrs  #Hypokalemia K+3.4 on admission, initially patient could not tolerate IV K due to burning. This morning K+ is 4.7.  --Follow up on BMP --D/c Fluid with potassium --Replete as needed  #Limb-girdle muscular dystrophy, chronic, worsening Follows with Neurologist Dr. Anne Hahn. Progressive weakness resulting in swallowing and breathing difficulties at this time. Last BM was Thursday, on senna at SNF, but has not been eating much d/t difficulty with swallowing. Patient had g tube placed yesterday and tolerated procedure well. Recommend starting feed   --NPO for g-tube placement  #Protein-Calorie Malnutrition Had modified barium swallow 6/26 demonstrating aspiration, recommended NPO and sips of water for dry mouth. Patient asked for clear liquid diet and acknowledged risk of aspiration. G- tube placed yesterday and could start feed tonight. --Follow up on nutrition consult, appreciate recs  #Tachycardia, resolved  Initially tachycardic on admission, likely secondary to anxiety for respiratory distress and albuterol treatment. Has resolved, with HR within normal limit. 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 --Xarelto held, lovenox per pharmacy  FEN/GI: NOP for g-tube placement Prophylaxis: hold xarelto while NPO, lovenox per pharm  Disposition: pending clinical improvement, back to ALF  Subjective:  Patient is at baseline this morning, was able to tolerate Bipap overnight. Reports some secretions overnight. Pain was better control with Toradol.   Objective: Temp:  [96.9 F (36.1 C)-98 F (36.7 C)] 97.4 F (36.3 C) (06/29 0745) Pulse Rate:  [70-103] 82 (06/29 0745) Resp:  [14-30] 23 (06/29 0745) BP: (93-135)/(68-93) 108/76 (06/29 0745) SpO2:  [98 %-100 %] 98 % (06/29 0904)   Physical Exam: General: thin chronically ill appearing man sitting up in bed, [place] in place, in NAD Cardiovascular: regular rate , regular rhythm. No MRG Respiratory: coarse breath sounds bilaterally with decreased air movement Abdomen: soft, NTND, g tube site is clean, no erythema, or warmth Extremities: Thin. Decreased tone in LE.   Laboratory:  Recent Labs Lab Dec 16, 2016 1715 12/09/16 0533  WBC 8.6 4.5  HGB 16.6 12.8*  HCT 50.6 40.6  PLT 298 210    Recent Labs Lab 12-16-16 1930  12/08/16 1529 12/09/16 0533 12/10/16 0231  NA  --   < > 143 144 136  K  --   < > 3.6 4.4 4.7  CL  --   < > 110 111 105  CO2  --   < > 29 25 23   BUN  --   < > <5* <5* <5*  CREATININE  --   < > <0.30* <0.30* <0.30*  CALCIUM   --   < > 8.9 9.1 8.9  PROT 7.7  --   --   --   --   BILITOT 1.2  --   --   --   --   ALKPHOS 68  --   --   --   --   ALT 23  --   --   --   --   AST 22  --   --   --   --   GLUCOSE  --   < > 92 73 58*  < > = values in this interval not displayed.  Imaging/Diagnostic Tests: Ir Gastrostomy Tube Mod Sed  Result Date: 12/09/2016 INDICATION: History of muscle dystrophy, now with progressive dysphagia. Please perform percutaneous gastrostomy tube placement for enteric nutrition supplementation. EXAM: PULL TROUGH GASTROSTOMY TUBE PLACEMENT COMPARISON:  CT the abdomen pelvis - 12/08/2016 MEDICATIONS: Ancef 2 gm IV; Antibiotics were administered within 1 hour of the procedure. Glucagon 1 mg IV CONTRAST:  20 cc Isovue-300 administered into the gastric lumen. ANESTHESIA/SEDATION: Moderate (conscious) sedation was employed during this procedure. A total of Versed 1 mg and Fentanyl 75 mcg was administered intravenously. Moderate Sedation Time: 15 minutes. The patient's level of consciousness and vital signs were monitored continuously by radiology nursing throughout the procedure under my direct supervision. FLUOROSCOPY TIME:  4 minutes 36 seconds (51 mGy) COMPLICATIONS: None immediate. PROCEDURE: Informed written consent was obtained from the patient following explanation of the procedure, risks, benefits and alternatives. A time out was performed prior to the initiation of the procedure. Ultrasound scanning was performed to demarcate the edge of the left lobe of the liver. Maximal barrier sterile technique utilized including caps, mask, sterile gowns, sterile gloves, large sterile drape, hand hygiene and Betadine prep. The left upper quadrant was sterilely prepped and draped. An oral gastric catheter was inserted into the stomach under fluoroscopy. The existing nasogastric feeding tube was removed. The left costal margin and air / barium opacified transverse colon were identified and avoided. Air was injected into  the stomach for insufflation and visualization under fluoroscopy. Under sterile conditions a 17 gauge trocar needle was utilized to access the stomach percutaneously beneath the left subcostal margin after the overlying soft tissues were anesthetized with 1% Lidocaine with epinephrine. Needle position was confirmed within the stomach with aspiration of air and injection of small amount of contrast. A single T tack was deployed for gastropexy. Over an Amplatz guide wire, a 9-French sheath was inserted into the stomach. A snare device was utilized to capture the oral gastric catheter. The snare device was pulled retrograde from the stomach up the esophagus and out the oropharynx. The 20-French pull-through gastrostomy was connected to the snare device and pulled antegrade through the oropharynx down the esophagus into the stomach and then through the percutaneous tract external to the patient. The gastrostomy was assembled externally. Contrast injection confirms position in the stomach. Several spot radiographic images were obtained in various obliquities for documentation. The patient tolerated procedure well without immediate post procedural complication. FINDINGS: After successful fluoroscopic guided placement, the gastrostomy tube is appropriately positioned with internal disc against the ventral aspect of the gastric  lumen. IMPRESSION: Successful fluoroscopic insertion of a 20-French pull-through gastrostomy tube. The gastrostomy may be used immediately for medication administration and in 24 hrs for the initiation of feeds. Electronically Signed   By: Simonne Come M.D.   On: 12/09/2016 17:31    Lovena Neighbours, MD 12/10/2016, 9:18 AM PGY-1, [facility] FPTS Intern pager: [phone], text pages welcome

## 2016-12-10 NOTE — Progress Notes (Signed)
eLink Physician-Brief Progress Note Patient Name: George Evans DOB: [birth year] MRN: [id number]   Date of Service  12/10/2016  HPI/Events of Note  Wheezing   eICU Interventions  Prn albuterol     Intervention Category Intermediate Interventions: Respiratory distress - evaluation and management  Max Fickle 12/10/2016, 7:56 PM

## 2016-12-10 NOTE — Progress Notes (Signed)
Patient ID: George Evans, male   DOB: 12-03-1955, 61 y.o.   MRN: [id number]    Referring Physician(s): Dr. Cyril Mourning  Supervising Physician: Malachy Moan  Patient Status: [facility] - In-pt  Chief Complaint: dysphagia  Subjective: Patient complains of lots of secretions this am causing breathing issues.  C/o soreness in his abdomen.  Allergies: Coconut oil  Medications: Prior to Admission medications   Medication Sig Start Date End Date Taking? Authorizing Provider  cholecalciferol (VITAMIN D) 1000 units tablet Take 1,000 Units by mouth daily.   Yes [provider]  fluticasone (FLONASE) 50 MCG/ACT nasal spray Place 2 sprays into both nostrils daily. 12/01/16  Yes Marquette Saa, MD  glycopyrrolate (ROBINUL) 1 MG tablet Take 0.5 tablets (0.5 mg total) by mouth every 8 (eight) hours. 11/30/16  Yes Marquette Saa, MD  guaiFENesin-codeine [facility]) 100-10 MG/5ML syrup Take 10 mLs by mouth every 6 (six) hours.   Yes [provider]  ondansetron (ZOFRAN) 4 MG tablet Take 4 mg by mouth every 8 (eight) hours as needed for nausea or vomiting.   Yes [provider]  rivaroxaban (XARELTO) 20 MG TABS tablet Take 20 mg by mouth daily.   Yes [provider]  senna (SENOKOT) 8.6 MG tablet Take 2 tablets by mouth at bedtime. Hold for loose stools   Yes [provider]    Vital Signs: BP 108/76 (BP Location: Left Arm)   Pulse 82   Temp 97.4 F (36.3 C) (Oral)   Resp (!) 23   Ht 6' (1.829 m)   Wt 155 lb (70.3 kg)   SpO2 98%   BMI 21.02 kg/m   Physical Exam: Abd: soft, appropriately tender given procedure yesterday, site is c/d/i.  Imaging: Ct Abdomen Wo Contrast  Result Date: 12/08/2016 CLINICAL DATA:  61 year old male with dysphagia EXAM: CT ABDOMEN WITHOUT CONTRAST TECHNIQUE: Multidetector CT imaging of the abdomen was performed following the standard protocol without IV contrast. COMPARISON:  None. FINDINGS: Lower  chest: Chronic elevation of the left hemidiaphragm with associated atelectasis and bronchiolectasis. Trace dependent atelectasis in the right lower lobe. No focal airspace consolidation. The heart is normal in size. No pericardial effusion. Unremarkable distal thoracic esophagus. Hepatobiliary: Normal hepatic contour and morphology. No discrete hepatic lesions. Normal appearance of the gallbladder. No intra or extrahepatic biliary ductal dilatation. Pancreas: Unremarkable. No pancreatic ductal dilatation or surrounding inflammatory changes. Spleen: Normal in size without focal abnormality. Adrenals/Urinary Tract: Normal adrenal glands bilaterally. At least 3 focal radio opacities are present within the right renal collecting system consistent with nonobstructing nephrolithiasis. The largest stone measures 4 mm. No definite nephrolithiasis on the left. No evidence of hydronephrosis. Stomach/Bowel: High density contrast is present within the colon, likely related to the patient's recent modified barium swallow. The stomach and visualized small bowel are unremarkable. No focal bowel wall thickening or evidence of obstruction. No free fluid. Vascular/Lymphatic: Very mild atherosclerotic calcifications in the abdominal aorta. No evidence of aneurysm. No suspicious adenopathy. Other: Subcutaneous emphysema in the left lower quadrant anterior abdominal wall likely related to subcutaneous injections. Musculoskeletal: Severe fatty atrophy of all visible muscles. No acute osseous abnormality. IMPRESSION: 1. Nonobstructing right-sided nephrolithiasis. 2. Chronic elevation of the left hemidiaphragm with associated left lower lobe atelectasis and bronchiolectasis 3. The recently ingested barium contrast is identified within the colon. Unremarkable imaged stomach and small bowel. 4.  Aortic Atherosclerosis (ICD10-170.0) 5. Severe fatty atrophy of all visible muscle belly is consistent with the clinical history of muscular  dystrophy. Electronically Signed   By: Malachy Moan M.D.   On: 12/08/2016 19:14   Dg Chest 1 View  Result Date: 11/18/2016 CLINICAL DATA:  Shortness of breath and cough EXAM: CHEST 1 VIEW COMPARISON:  Chest radiograph 11/27/2016 FINDINGS: Unchanged elevation of the left hemidiaphragm with associated left basilar atelectasis. Lungs are otherwise clear. Normal cardiomediastinal contours. IMPRESSION: Unchanged left hemidiaphragmatic elevation and associated atelectasis. Electronically Signed   By: Deatra Robinson M.D.   On: 11/27/2016 13:46   Ir Gastrostomy Tube Mod Sed  Result Date: 12/09/2016 INDICATION: History of muscle dystrophy, now with progressive dysphagia. Please perform percutaneous gastrostomy tube placement for enteric nutrition supplementation. EXAM: PULL TROUGH GASTROSTOMY TUBE PLACEMENT COMPARISON:  CT the abdomen pelvis - 12/08/2016 MEDICATIONS: Ancef 2 gm IV; Antibiotics were administered within 1 hour of the procedure. Glucagon 1 mg IV CONTRAST:  20 cc Isovue-300 administered into the gastric lumen. ANESTHESIA/SEDATION: Moderate (conscious) sedation was employed during this procedure. A total of Versed 1 mg and Fentanyl 75 mcg was administered intravenously. Moderate Sedation Time: 15 minutes. The patient's level of consciousness and vital signs were monitored continuously by radiology nursing throughout the procedure under my direct supervision. FLUOROSCOPY TIME:  4 minutes 36 seconds (51 mGy) COMPLICATIONS: None immediate. PROCEDURE: Informed written consent was obtained from the patient following explanation of the procedure, risks, benefits and alternatives. A time out was performed prior to the initiation of the procedure. Ultrasound scanning was performed to demarcate the edge of the left lobe of the liver. Maximal barrier sterile technique utilized including caps, mask, sterile gowns, sterile gloves, large sterile drape, hand hygiene and Betadine prep. The left upper quadrant was  sterilely prepped and draped. An oral gastric catheter was inserted into the stomach under fluoroscopy. The existing nasogastric feeding tube was removed. The left costal margin and air / barium opacified transverse colon were identified and avoided. Air was injected into the stomach for insufflation and visualization under fluoroscopy. Under sterile conditions a 17 gauge trocar needle was utilized to access the stomach percutaneously beneath the left subcostal margin after the overlying soft tissues were anesthetized with 1% Lidocaine with epinephrine. Needle position was confirmed within the stomach with aspiration of air and injection of small amount of contrast. A single T tack was deployed for gastropexy. Over an Amplatz guide wire, a 9-French sheath was inserted into the stomach. A snare device was utilized to capture the oral gastric catheter. The snare device was pulled retrograde from the stomach up the esophagus and out the oropharynx. The 20-French pull-through gastrostomy was connected to the snare device and pulled antegrade through the oropharynx down the esophagus into the stomach and then through the percutaneous tract external to the patient. The gastrostomy was assembled externally. Contrast injection confirms position in the stomach. Several spot radiographic images were obtained in various obliquities for documentation. The patient tolerated procedure well without immediate post procedural complication. FINDINGS: After successful fluoroscopic guided placement, the gastrostomy tube is appropriately positioned with internal disc against the ventral aspect of the gastric lumen. IMPRESSION: Successful fluoroscopic insertion of a 20-French pull-through gastrostomy tube. The gastrostomy may be used immediately for medication administration and in 24 hrs for the initiation of feeds. Electronically Signed   By: Simonne Come M.D.   On: 12/09/2016 17:31   Dg Swallowing Func-speech Pathology  Result  Date: 12/07/2016 Objective Swallowing Evaluation: Type of Study: MBS-Modified Barium Swallow Study Patient Details Name: Constantinos Krempasky MRN: [id number] Date of Birth: Apr 02, 1956 Today's Date: 12/07/2016 Time: SLP  Start Time (ACUTE ONLY): 1340-SLP Stop Time (ACUTE ONLY): 1420 SLP Time Calculation (min) (ACUTE ONLY): 40 min Past Medical History: Past Medical History: Diagnosis Date . Abnormal posture  . Chronic viral hepatitis C (HCC)  . Constipation  . Difficulty walking  . Distal muscular dystrophy (HCC)  . 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 Major depressive disorder  . Muscle wasting  . Muscle weakness  . Other diseases of bronchus, not elsewhere classified (CODE)  . Peripheral vascular disease (HCC)  . Pressure ulcer of sacral region  Past Surgical History: Past Surgical History: Procedure Laterality Date . APPENDECTOMY  1992 . OTHER SURGICAL HISTORY  2016  Abcess removed from penis . ROTATOR CUFF REPAIR Right 1989 HPI: Pt is a 61 y.o. male with PMH of limb-girdle muscular dystrophy, multiple PEs, and previously treated hepatitis C, presenting to ED on 6/25 with SOB and mucus plugging/ inability to handle secretions, intermittently requiring BiPAP in ED. Pt reportedly not eating much due to difficulty swallowing. Pt previously seen on recent admission after concern for pill aspiration (11/28/16). A MBS was completed March 2018 at outside facility- report unavailable but per SLP note 11/28/16 pt reported being told to have small bites/ sips and that there was a high aspiration risk although aspiration not observed during study. During previous evaluation recommendation was for a dysphagia 3 diet, thin liquids, meds crushed in puree along with compensatory strategies- multiple swallows, follow solids with liquid, effortful swallow. Bedside swallow eval ordered to re-evaluate.  Subjective: pt alert, says his swallowing feels unchanged since prior MBS, has particularly things he will or will not eat Assessment /  Plan / Recommendation CHL IP CLINICAL IMPRESSIONS 12/07/2016 Clinical Impression Pt currently with a severe pharyngeal phase/ cervical esophageal phase, motor-based dysphagia. Pharyngeal phase characterized by significantly reduced epiglottic inversion/ pharyngeal peristalsis and reduced hyolaryngeal excursion resulting in reduced airway closure and aspiration of thin liquids, both during the swallow and then after the swallow due to residuals. Pt with very weak cough and unable to clear aspirated material. Very small amounts (25% at most) of the bolus actually appeared to pass through the UES. At the cervical esophageal level, there is an appearance of a prominent UES and reduced UES opening, also impacting swallow function, along with poor head/ trunk control which at times appeared to push bolus forward toward airway. Significant residuals in the pharyngeal spaces post-swallow were minimally cleared on subsequent swallows. Numerous strategies attempted; the Mendelsohn maneuver was most effective in clearing bolus past the UES; however pt continued to have significant pharyngeal residuals. Given these findings, pt is at a severe risk of aspiration and inadequate nutrition. Pt reported having discussion with MD regarding possible PEG placement and that this is under consideration. Will defer diet decision to MD; safest diet if pursued would be thin liquid only with strategies in place but pt would still be at severe risk of aspiration. Pt would be a great candidate for a free water protocol following thorough oral care for comfort. Pt may also have some short-term benefit from pharyngeal swallow exercises although prognosis for significant improvement is guarded. Will continue to follow for education/ review of compensatory strategies/ exercises if a diet or free water protocol is desired.  SLP Visit Diagnosis Dysphagia, pharyngoesophageal phase (R13.14) Attention and concentration deficit following -- Frontal lobe  and executive function deficit following -- Impact on safety and function Severe aspiration risk;Risk for inadequate nutrition/hydration   CHL IP TREATMENT RECOMMENDATION 12/07/2016 Treatment Recommendations Therapy as outlined in treatment plan  below   Prognosis 12/07/2016 Prognosis for Safe Diet Advancement Guarded Barriers to Reach Goals Severity of deficits Barriers/Prognosis Comment -- CHL IP DIET RECOMMENDATION 12/07/2016 SLP Diet Recommendations NPO;Alternative means - long-term;Free water protocol after oral care Liquid Administration via Straw Medication Administration Via alternative means Compensations Slow rate;Small sips/bites;Multiple dry swallows after each bite/sip;Other (Comment)- Mendelsohn maneuver Postural Changes Remain semi-upright after after feeds/meals (Comment);Seated upright at 90 degrees   CHL IP OTHER RECOMMENDATIONS 12/07/2016 Recommended Consults -- Oral Care Recommendations Oral care QID Other Recommendations Have oral suction available   CHL IP FOLLOW UP RECOMMENDATIONS 12/07/2016 Follow up Recommendations 24 hour supervision/assistance   CHL IP FREQUENCY AND DURATION 12/07/2016 Speech Therapy Frequency (ACUTE ONLY) min 2x/week Treatment Duration 1 week      CHL IP ORAL PHASE 12/07/2016 Oral Phase WFL Oral - Pudding Teaspoon -- Oral - Pudding Cup -- Oral - Honey Teaspoon -- Oral - Honey Cup -- Oral - Nectar Teaspoon -- Oral - Nectar Cup -- Oral - Nectar Straw -- Oral - Thin Teaspoon -- Oral - Thin Cup -- Oral - Thin Straw -- Oral - Puree -- Oral - Mech Soft -- Oral - Regular -- Oral - Multi-Consistency -- Oral - Pill -- Oral Phase - Comment --  CHL IP PHARYNGEAL PHASE 12/07/2016 Pharyngeal Phase Impaired Pharyngeal- Pudding Teaspoon -- Pharyngeal -- Pharyngeal- Pudding Cup -- Pharyngeal -- Pharyngeal- Honey Teaspoon -- Pharyngeal -- Pharyngeal- Honey Cup -- Pharyngeal -- Pharyngeal- Nectar Teaspoon -- Pharyngeal -- Pharyngeal- Nectar Cup -- Pharyngeal -- Pharyngeal- Nectar Straw --  Pharyngeal -- Pharyngeal- Thin Teaspoon Reduced pharyngeal peristalsis;Reduced epiglottic inversion;Reduced anterior laryngeal mobility;Reduced laryngeal elevation;Reduced airway/laryngeal closure;Penetration/Aspiration during swallow;Penetration/Apiration after swallow;Pharyngeal residue - valleculae;Pharyngeal residue - pyriform;Pharyngeal residue - cp segment;Compensatory strategies attempted (with notebox) Pharyngeal (No Data) Pharyngeal- Thin Cup -- Pharyngeal -- Pharyngeal- Thin Straw Reduced pharyngeal peristalsis;Reduced epiglottic inversion;Reduced anterior laryngeal mobility;Reduced laryngeal elevation;Reduced airway/laryngeal closure;Penetration/Aspiration during swallow;Penetration/Apiration after swallow;Pharyngeal residue - valleculae;Pharyngeal residue - pyriform;Pharyngeal residue - cp segment;Compensatory strategies attempted (with notebox) Pharyngeal (No Data) Pharyngeal- Puree Reduced pharyngeal peristalsis;Reduced epiglottic inversion;Reduced anterior laryngeal mobility;Reduced laryngeal elevation;Reduced airway/laryngeal closure;Pharyngeal residue - valleculae;Pharyngeal residue - pyriform;Pharyngeal residue - cp segment Pharyngeal -- Pharyngeal- Mechanical Soft -- Pharyngeal -- Pharyngeal- Regular -- Pharyngeal -- Pharyngeal- Multi-consistency -- Pharyngeal -- Pharyngeal- Pill -- Pharyngeal -- Pharyngeal Comment --  CHL IP CERVICAL ESOPHAGEAL PHASE 12/07/2016 Cervical Esophageal Phase Impaired Pudding Teaspoon -- Pudding Cup -- Honey Teaspoon -- Honey Cup -- Nectar Teaspoon -- Nectar Cup -- Nectar Straw -- Thin Teaspoon Reduced cricopharyngeal relaxation;Prominent cricopharyngeal segment Thin Cup -- Thin Straw Reduced cricopharyngeal relaxation;Prominent cricopharyngeal segment Puree Reduced cricopharyngeal relaxation;Prominent cricopharyngeal segment Mechanical Soft -- Regular -- Multi-consistency -- Pill -- Cervical Esophageal Comment -- No flowsheet data found. Metro Kung, MA, CCC-SLP  12/07/2016, 2:32 PM [phone]              Labs:  CBC:  Recent Labs  11/27/16 1336 11/28/16 0310 12/29/16 1715 12/09/16 0533  WBC 5.1 5.9 8.6 4.5  HGB 14.8 12.9* 16.6 12.8*  HCT 46.1 41.3 50.6 40.6  PLT 259 210 298 210    COAGS:  Recent Labs  12/09/16 1123  INR 1.12    BMP:  Recent Labs  12/08/16 1030 12/08/16 1529 12/09/16 0533 12/10/16 0231  NA 144 143 144 136  K 2.4* 3.6 4.4 4.7  CL 110 110 111 105  CO2 27 29 25 23   GLUCOSE 87 92 73 58*  BUN <5* <5* <5* <5*  CALCIUM 9.1 8.9 9.1 8.9  CREATININE <0.30* <0.30* <0.30* <0.30*  GFRNONAA NOT CALCULATED NOT CALCULATED NOT CALCULATED NOT CALCULATED  GFRAA NOT CALCULATED NOT CALCULATED NOT CALCULATED NOT CALCULATED    LIVER FUNCTION TESTS:  Recent Labs  12-14-16 1930  BILITOT 1.2  AST 22  ALT 23  ALKPHOS 68  PROT 7.7  ALBUMIN 3.9    Assessment and Plan: 1. Dysphagia, s/p g-tube placement on 6/28  Patient doing well.  Tenderness is appropriate.  May use tube for medications now and TFs 24hrs after procedure.  Routine g-tube care with flushings etc.  Call IR if questions or concerns arise.  Electronically Signed: Letha Cape 12/10/2016, 10:25 AM   I spent a total of 15 Minutes at the the patient's bedside AND on the patient's hospital floor or unit, greater than 50% of which was counseling/coordinating care for dysphagia

## 2016-12-10 NOTE — Progress Notes (Signed)
SLP Cancellation Note  Patient Details Name: George Evans MRN: [id number] DOB: 1955/11/15   Cancelled treatment:       Reason Eval/Treat Not Completed: Medical issues which prohibited therapy. Per chart, pt is struggling with secretions today and now pending trach placement. Will hold swallow tx/PO trials today.   Maxcine Ham 12/10/2016, 3:05 PM  Maxcine Ham, M.A. CCC-SLP [phone]

## 2016-12-10 NOTE — Progress Notes (Signed)
PT Cancellation Note  Patient Details Name: George Evans MRN: [id number] DOB: 1956/01/05   Cancelled Treatment:    Reason Eval/Treat Not Completed: Patient not medically ready.  Pt transferred to the unit.  To be intubated and trached.  Will defer evaluation today and see as appropriate 6/30. 12/10/2016  [place] Bing, PT [phone]  (pager)   Eliseo Gum Tupac Jeffus 12/10/2016, 12:37 PM

## 2016-12-10 NOTE — Progress Notes (Signed)
   Name: George Evans MRN: [id number] DOB: Mar 06, 1956    ADMISSION DATE:  01-05-2017 CONSULTATION DATE:  6/25   REFERRING MD :  Dr. 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 . He was recently admitted to [facility] 6/16 > 6/19 after he was thought to have aspirated a pill. This was not found to be the case on CXR or bronchoscopy. Course complicated by acute on chronic respiratory failure secondary to excessive secretions and progression of MD. He was started on Trilogy Vent, Chest PT, and suctioning with improvement. Unable to be discharged on Trilogy so he was recommended nocturnal BiPAP, which he tolerated well and was discharged to [facility].  Since discharge he has been unable to use BiPAP due to some issue at facility. Initially he was doing OK, however, 6/25 he developed acute onset dyspnea , readmitted for difficulty clearing secretions   SIGNIFICANT EVENTS  6/16 > 6/19 admit for respiratory failure and ? Pill aspiration. 6/25 > admit with dyspnea  STUDIES:  Admit CXR > L hemidiaphragmatic elevation and associated ATX  SUBJECTIVE:   Used Bipap overnight x 6h Continues to struggle with secretions , episode this am took a long time to recover with NTS  VITAL SIGNS: Temp:  [96.9 F (36.1 C)-98 F (36.7 C)] 97.6 F (36.4 C) (06/29 1124) Pulse Rate:  [70-103] 87 (06/29 1124) Resp:  [14-33] 33 (06/29 1124) BP: (93-135)/(68-93) 126/92 (06/29 1124) SpO2:  [98 %-100 %] 100 % (06/29 1124) Weight:  [134 lb 11.2 oz (61.1 kg)] 134 lb 11.2 oz (61.1 kg) (06/29 1235)  PHYSICAL EXAMINATION: General:  Frail adult male in NAD  Neuro:  Alert, oriented, non-focal HEENT:  [place]/AT, PERRL, no JVD , facial wasting Cardiovascular:  RRR, no MRG Lungs:  Bl scattered rhonchi, improved.  Abdomen:  Soft, non-tender,  non-distended Musculoskeletal:  No acute deformity Skin:  Grossly intact   Recent Labs Lab 12/08/16 1529 12/09/16 0533 12/10/16 0231  NA 143 144 136  K 3.6 4.4 4.7  CL 110 111 105  CO2 29 25 23   BUN <5* <5* <5*  CREATININE <0.30* <0.30* <0.30*  GLUCOSE 92 73 58*    Recent Labs Lab 01-05-2017 1715 12/09/16 0533 12/10/16 0849  HGB 16.6 12.8* 13.6  HCT 50.6 40.6 42.7  WBC 8.6 4.5 5.1  PLT 298 210 208     ASSESSMENT / PLAN:  Acute on chronic respiratory failure:with hypoventilation in the setting of progressive muscular dystrophy complicated by impaired clearance of respiratory secretions.  PLAN -  Continue mucinex, mucomyst, chest PT  PRN bipap daytime  Mandatory qhs bipap  Robinul discontinued   I had long discussion with pt & his wife janet & we finally agreed to proceed with tracheostomy However he got lovenox this am , hence procedure deferred until 6/30  H/o PE - on xarelto  - hold anticoagulation until after procedure  Protein calorie Malnutrition: s/p PEG PLAN -   Start PEG Tfs  Will ask SW to work on placment  My cc time x 58m  Cyril Mourning MD. Tonny Bollman. [facility] Pager [phone] If no response call [phone]    12/10/2016  1:07 PM

## 2016-12-10 NOTE — Progress Notes (Addendum)
Nutrition Follow-up  DOCUMENTATION CODES:   Not applicable  INTERVENTION:   Initiate Osmolite 1.2 @ 20 ml/hr via PEG and increase by 10 ml every 4 hours to goal rate of 60 ml/hr.   30 ml Prostat (or equivalent) daily.    Tube feeding regimen provides 1828 kcal (100% of needs), 95 grams of protein, and 1180 ml of H2O.   If no IVFS, recommend 100 ml free water flush every 4 hours.  NUTRITION DIAGNOSIS:   Inadequate oral intake related to dysphagia as evidenced by NPO status.  Ongoing  GOAL:   Patient will meet greater than or equal to 90% of their needs  Progressing  MONITOR:   Labs, Weight trends, TF tolerance, Skin, I & O's  REASON FOR ASSESSMENT:   Consult Enteral/tube feeding initiation and management  ASSESSMENT:   George Evans is a 61 y.o. male presenting with SOB . PMH is significant for limb-girdle muscular dystrophy, multiple PEs, and previously treated hepatitis C.   Pt underwent PEG placement on 12/10/16. Per radiology note, TF can be initiated 24 hours after placement.   RD evaluated pt on 12/10/16 prior to PEG placement. Refer to note for further details.   Labs reviewed: CBGS: 108.  Diet Order:  Diet NPO time specified  Skin:  Reviewed, no issues  Last BM:  PTA  Height:   Ht Readings from Last 1 Encounters:  11/29/2016 6' (1.829 m)    Weight:   Wt Readings from Last 1 Encounters:  12/11/2016 155 lb (70.3 kg)    Ideal Body Weight:  80.9 kg  BMI:  Body mass index is 21.02 kg/m.  Estimated Nutritional Needs:   Kcal:  1750-1950  Protein:  90-105 grams  Fluid:  1.7-1.9 L  EDUCATION NEEDS:   Education needs addressed  Armaan Pond A. Mayford Knife, RD, LDN, CDE Pager: [phone] After hours Pager: [phone]

## 2016-12-10 NOTE — Progress Notes (Signed)
ANTICOAGULATION CONSULT NOTE  Pharmacy Consult for Lovenox Indication: history of multiple pulmonary emboli   Assessment: 18 yom with history of multiples PEs on Xarelto PTA. No active PE. Pharmacy consulted to transitioned to Lovenox when NPO  Now s/p feeding tube 6/28  CBC stable  Goal of Therapy:  Anti-Xa level 0.6-1 units/ml 4hrs after LMWH dose given Monitor platelets by anticoagulation protocol: Yes   Plan:  Continue Lovenox 70 mg sq q 12 hours F/u ability to transition back to Xarelto as appropriate  Thank you Okey Regal, PharmD [phone] 12/10/2016 9:19 AM

## 2016-12-10 NOTE — Progress Notes (Signed)
Daily Progress Note   Patient Name: George Evans       Date: 12/10/2016 DOB: 02/15/1956  Age: 61 y.o. MRN#: [id number] Attending Physician: Carney Living, MD Primary Care Physician: York Spaniel, MD Admit Date: 12/20/2016  Reason for Consultation/Follow-up: Establishing goals of care  Subjective/GOC: Upon arrival to room, patient with dyspnea and tachypnea. C/o of mild pain at PEG tube site relieved by Toradol. Wife at bedside states "It's been a rough morning." Tolerated intermittent BiPAP last night but episodes of respiratory distress this AM requiring oxygen and suctioning.   Dr. Vassie Loll at bedside. Full scope treatment. Patient and wife have chosen to pursue trach tube placement. Will be transferred to ICU to be intubated and then trached.  Patient tearful. He is most worried about placement after hospitalization and being away from his family. He is hopeful to be able to sit in wheelchair despite PEG and trach. Provided emotional and spiritual support. Family very supportive at bedside.   Length of Stay: 4  Current Medications: Scheduled Meds:  . albuterol  2.5 mg Nebulization BID AC & HS  . enoxaparin (LOVENOX) injection  1 mg/kg Subcutaneous Q12H  . feeding supplement (PRO-STAT SUGAR FREE 64)  30 mL Per Tube Daily  . fluticasone  2 spray Each Nare Daily    Continuous Infusions: . dextrose 5 % and 0.9% NaCl    . feeding supplement (OSMOLITE 1.2 CAL)      PRN Meds: acetaminophen, guaiFENesin, senna-docusate  Physical Exam  Constitutional: He is oriented to person, place, and time. He appears cachectic. He is cooperative. He appears ill.  HENT:  Head: Normocephalic and atraumatic.  Cardiovascular: Regular rhythm.   Pulmonary/Chest: Effort normal. He has decreased  breath sounds.  Abdominal: Normal appearance.  Neurological: He is alert and oriented to person, place, and time.  Skin: Skin is warm and dry.  Psychiatric: He has a normal mood and affect. His speech is normal and behavior is normal.  Nursing note and vitals reviewed.          Vital Signs: BP 108/76 (BP Location: Left Arm)   Pulse 82   Temp 97.4 F (36.3 C) (Oral)   Resp (!) 23   Ht 6' (1.829 m)   Wt 70.3 kg (155 lb)   SpO2 98%   BMI 21.02 kg/m  SpO2: SpO2: 98 % O2 Device: O2 Device: Nasal Cannula O2 Flow Rate: O2 Flow Rate (L/min): 2.5 L/min  Intake/output summary:   Intake/Output Summary (Last 24 hours) at 12/10/16 1026 Last data filed at 12/10/16 0645  Gross per 24 hour  Intake          2593.75 ml  Output             3050 ml  Net          -456.25 ml   LBM:   Baseline Weight: Weight: 70.3 kg (155 lb) Most recent weight: Weight: 70.3 kg (155 lb)  Palliative Assessment/Data: PPS 30%   Flowsheet Rows     Most Recent Value  Intake Tab  Referral Department  Hospitalist  Unit at Time of Referral  Intermediate Care Unit  Palliative Care Primary Diagnosis  Pulmonary  Date Notified  11/13/2016  Palliative Care Type  New Palliative care  Reason for referral  Clarify Goals of Care  Date of Admission  12/05/2016  Date first seen by Palliative Care  12/07/16  # of days Palliative referral response time  1 Day(s)  # of days IP prior to Palliative referral  0  Clinical Assessment  Palliative Performance Scale Score  30%  Psychosocial & Spiritual Assessment  Palliative Care Outcomes  Patient/Family meeting held?  Yes  Who was at the meeting?  patient, wife, sister  Palliative Care Outcomes  Clarified goals of care, Counseled regarding hospice, ACP counseling assistance, Provided psychosocial or spiritual support      Patient Active Problem List   Diagnosis Date Noted  . At high risk for aspiration   . Hypokalemia   . Moderate protein-calorie malnutrition (HCC)   .  Dysphagia   . Palliative care by specialist   . Goals of care, counseling/discussion   . SOB (shortness of breath)   . Acute neuromuscular respiratory failure (HCC) 12/08/2016  . 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 MS (multiple sclerosis) (HCC)   . Aspiration into airway 11/27/2016  . Choking   . Muscular weakness   . Respiratory failure, chronic neuromuscular (HCC) 09/14/2016  . 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    Palliative Care Assessment & Plan   Patient Profile: 61 y.o. male  with past medical history of limb-girdle muscular dystrophy, PVD, PE on xarelto, depression, viral hepatitis C, and dysphagia admitted on 11/30/2016 with shortness of breath. Recent hospitalization from 6/16-6/19 for acute respiratory failure due to poor secretion clearance. Discharged to SNF with orders for HS BiPAP but there was complications with BiPAP at the facility. In ED, patient placed on BiPAP. Patient with acute on chronic respiratory failure in the setting of progressive muscular dystrophy. PRN and HS BiPAP. Receiving chest physical therapy, nebs, flonase, robitussin, and robinul. Per PCCM, may need elective PEG placement. Palliative medicine consultation for goals of care.   Assessment: Acute respiratory failure due to mucus plugging Limb-girdle muscular dystrophy Hypokalemia Dysphagia High risk for aspiration Protein-calorie malnutrition  Recommendations/Plan:  FULL code/FULL scope treatment  PEG yesterday--Toradol prn pain    Patient/wife pursuing trach placement possibly today.  PMT will continue to support patient/family through hospitalization.    Goals of Care and Additional Recommendations:  Limitations on Scope of Treatment: Full Scope Treatment  Code Status: FULL   Code Status Orders        Start     Ordered   12/02/2016 1908  Full code  Continuous     19-Dec-2016 1907      Code Status History    Date Active Date Inactive Code Status Order ID Comments User Context   11/27/2016  4:51 PM 11/30/2016  7:32 PM Full Code [id number]  Casey Burkitt, MD ED       Prognosis:   Unable to determine: guarded with high risk for aspiration, decompensation requiring intubation/mechanical ventilation, decreased functional and nutritional status, and underlying progressive muscular dystrophy.   Discharge Planning:  To Be Determined:   Care plan was discussed with patient, wife, sisters  Thank you for allowing the Palliative Medicine Team to assist in the care of this patient.   Time In: 1000 Time Out: 1045 Total Time Prolonged Time Billed  no      Greater than 50%  of this time was spent counseling and coordinating care related to the above assessment and plan.  Vennie Homans, FNP-C Palliative Medicine Team  Phone: [phone] Fax: [phone]  Please contact Palliative Medicine Team phone at [phone] for questions and concerns.

## 2016-12-11 ENCOUNTER — Inpatient Hospital Stay (HOSPITAL_COMMUNITY): Payer: Medicare Other

## 2016-12-11 LAB — GLUCOSE, CAPILLARY
GLUCOSE-CAPILLARY: 93 mg/dL (ref 65–99)
GLUCOSE-CAPILLARY: 105 mg/dL — AB (ref 65–99)
GLUCOSE-CAPILLARY: 90 mg/dL (ref 65–99)
Glucose-Capillary: 127 mg/dL — ABNORMAL HIGH (ref 65–99)

## 2016-12-11 LAB — BASIC METABOLIC PANEL
ANION GAP: 9 (ref 5–15)
CHLORIDE: 102 mmol/L (ref 101–111)
CO2: 26 mmol/L (ref 22–32)
Calcium: 9.4 mg/dL (ref 8.9–10.3)
Creatinine, Ser: <0.3 mg/dL — ABNORMAL LOW (ref 0.61–1.24)
Glucose, Bld: 92 mg/dL (ref 65–99)
POTASSIUM: 3.2 mmol/L — AB (ref 3.5–5.1)
SODIUM: 137 mmol/L (ref 135–145)

## 2016-12-11 MED ORDER — POTASSIUM CHLORIDE 20 MEQ/15ML (10%) PO SOLN
40.0000 meq | Freq: Once | ORAL | Status: AC
  Administered 2016-12-11: 40 meq
  Filled 2016-12-11: qty 30

## 2016-12-11 MED ORDER — SODIUM CHLORIDE 0.9 % IV SOLN: 0.0000 ug/min | INTRAVENOUS | Status: DC

## 2016-12-11 MED ORDER — MIDAZOLAM HCL 2 MG/2ML IJ SOLN
INTRAMUSCULAR | Status: AC
  Administered 2016-12-11: 2 mg
  Filled 2016-12-11: qty 2

## 2016-12-11 MED ORDER — FENTANYL CITRATE (PF) 100 MCG/2ML IJ SOLN
50.0000 ug | Freq: Once | INTRAMUSCULAR | Status: AC
  Administered 2016-12-11: 50 ug via INTRAVENOUS

## 2016-12-11 MED ORDER — SODIUM CHLORIDE 0.9 % IV SOLN
0.0000 ug/min | INTRAVENOUS | Status: DC
  Administered 2016-12-11: 100 ug/min via INTRAVENOUS
  Filled 2016-12-11 (×2): qty 4

## 2016-12-11 MED ORDER — SODIUM CHLORIDE 0.9 % IV SOLN
INTRAVENOUS | Status: AC
  Administered 2016-12-11: 21:00:00 via INTRAVENOUS

## 2016-12-11 MED ORDER — ATROPINE SULFATE 1 MG/10ML IJ SOSY
PREFILLED_SYRINGE | INTRAMUSCULAR | Status: AC
  Filled 2016-12-11: qty 10

## 2016-12-11 MED ORDER — HEPARIN (PORCINE) IN NACL 100-0.45 UNIT/ML-% IJ SOLN
1000.0000 [IU]/h | INTRAMUSCULAR | Status: AC
  Administered 2016-12-12: 1000 [IU]/h via INTRAVENOUS
  Filled 2016-12-11 (×3): qty 250

## 2016-12-11 MED ORDER — FENTANYL BOLUS VIA INFUSION
25.0000 ug | INTRAVENOUS | Status: DC | PRN
  Filled 2016-12-11: qty 25

## 2016-12-11 MED ORDER — FENTANYL CITRATE (PF) 100 MCG/2ML IJ SOLN: 25.0000 ug | INTRAMUSCULAR | Status: AC | PRN

## 2016-12-11 MED ORDER — FENTANYL 2500MCG IN NS 250ML (10MCG/ML) PREMIX INFUSION
25.0000 ug/h | INTRAVENOUS | Status: DC
  Administered 2016-12-11: 50 ug/h via INTRAVENOUS
  Administered 2016-12-13: 100 ug/h via INTRAVENOUS
  Filled 2016-12-11 (×2): qty 250

## 2016-12-11 MED ORDER — SODIUM CHLORIDE 0.9 % IV BOLUS (SEPSIS)
500.0000 mL | Freq: Once | INTRAVENOUS | Status: AC
  Administered 2016-12-11: 10:00:00 via INTRAVENOUS

## 2016-12-11 MED ORDER — SODIUM CHLORIDE 0.9 % IV SOLN
0.0000 ug/min | INTRAVENOUS | Status: DC
  Administered 2016-12-11: 25 ug/min via INTRAVENOUS
  Administered 2016-12-11: 66.667 ug/min via INTRAVENOUS
  Administered 2016-12-11: 25 ug/min via INTRAVENOUS
  Filled 2016-12-11 (×2): qty 1

## 2016-12-11 NOTE — Progress Notes (Signed)
I began chest PT and patient was unable to tolerate at this time. Chest PT lasted for about two minutes.

## 2016-12-11 NOTE — Progress Notes (Signed)
   Name: Angela Andaya MRN: [id number] DOB: 31-Oct-1955    ADMISSION DATE:  11/15/2016 CONSULTATION DATE:  6/25   REFERRING MD :  Dr. 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 . He was recently admitted to [facility] 6/16 > 6/19 after he was thought to have aspirated a pill. This was not found to be the case on CXR or bronchoscopy. Course complicated by acute on chronic respiratory failure secondary to excessive secretions and progression of MD. He was started on Trilogy Vent, Chest PT, and suctioning with improvement. Unable to be discharged on Trilogy so he was recommended nocturnal BiPAP, which he tolerated well and was discharged to [facility].  Since discharge he has been unable to use BiPAP due to some issue at facility. Initially he was doing OK, however, 6/25 he developed acute onset dyspnea , readmitted for difficulty clearing secretions   SIGNIFICANT EVENTS  6/16 > 6/19 admit for respiratory failure and ? Pill aspiration. 6/25 > admit with dyspnea 6/28 trach decided  STUDIES:  Admit CXR > L hemidiaphragmatic elevation and associated ATX  SUBJECTIVE:   Did not use bipap overnight No distress or hypoxia this am   VITAL SIGNS: Temp:  [97.6 F (36.4 C)-98.5 F (36.9 C)] 97.8 F (36.6 C) (06/30 0837) Pulse Rate:  [75-98] 89 (06/30 0700) Resp:  [14-33] 24 (06/30 0700) BP: (105-126)/(70-93) 110/73 (06/30 0700) SpO2:  [95 %-100 %] 98 % (06/30 0700) Weight:  [61.1 kg (134 lb 11.2 oz)-61.7 kg (136 lb 0.4 oz)] 61.7 kg (136 lb 0.4 oz) (06/30 0500)  PHYSICAL EXAMINATION: General: awake, calm no distress, requires sucitioning Neuro: nonfocal, weakness girdle HEENT: ronchi in upper airway PULM:onchi, reduced BS CV:  s1 s2 rrr no  GI: spft, low muscle mass, PEG wnl Extremities:  No  edema    Recent Labs Lab 12/09/16 0533 12/10/16 0231 12/11/16 0245  NA 144 136 137  K 4.4 4.7 3.2*  CL 111 105 102  CO2 25 23 26   BUN <5* <5* <5*  CREATININE <0.30* <0.30* <0.30*  GLUCOSE 73 58* 92    Recent Labs Lab 11/18/2016 1715 12/09/16 0533 12/10/16 0849  HGB 16.6 12.8* 13.6  HCT 50.6 40.6 42.7  WBC 8.6 4.5 5.1  PLT 298 210 208     ASSESSMENT / PLAN:  Acute on chronic respiratory failure:with hypoventilation in the setting of progressive muscular dystrophy complicated by impaired clearance of respiratory secretions.  PLAN -  Continue mucinex, mucomyst, chest PT  PRN bipap daytime , not used or lasty night Place ett now, 8 cc/kg, rate 14, 10% peep 5,  Pre o2 for trach planned abg to follow Rate to 20 for trach bronch assessment post trach for mucous plugging Post op pcxr for trach placement Will confirm ETT with bronch for trach Consent obtained by me  H/o PE - on xarelto  - hold anticoagulation until after procedure then consider heparin short acting for 24 hours assessing any bleeding  Protein calorie Malnutrition: s/p PEG PLAN -  PEG, start feeds post op trach  Renal Hypokalemia k supp bmet in am  caution low muslce mass crt prediction renal fxn I updated family  Ccm time 40 min   Mcarthur Rossetti. Tyson Alias, MD, FACP Pgr: [phone] [place] [facility]

## 2016-12-11 NOTE — Progress Notes (Signed)
ANTICOAGULATION CONSULT NOTE - Follow Up Consult  Pharmacy Consult for heparin Indication: hx multiple PE  Allergies  Allergen Reactions  . Coconut Oil Swelling    Patient Measurements: Height: 6' (182.9 cm) Weight: 136 lb 0.4 oz (61.7 kg) IBW/kg (Calculated) : 77.6 Heparin Dosing Weight: 61kg  Vital Signs: Temp: 97.8 F (36.6 C) (06/30 0837) Temp Source: Oral (06/30 0837) BP: 87/42 (06/30 1136) Pulse Rate: 62 (06/30 1139)  Labs:  Recent Labs  12/09/16 0533 12/09/16 1123 12/10/16 0231 12/10/16 0849 12/11/16 0245  HGB 12.8*  --   --  13.6  --   HCT 40.6  --   --  42.7  --   PLT 210  --   --  208  --   LABPROT  --  14.4  --   --   --   INR  --  1.12  --   --   --   CREATININE <0.30*  --  <0.30*  --  <0.30*    CrCl cannot be calculated (This lab value cannot be used to calculate CrCl because it is not a number: <0.30).   Medications:  Infusions:  . fentaNYL infusion INTRAVENOUS 50 mcg/hr (12/11/16 1010)  . heparin    . phenylephrine (NEO-SYNEPHRINE) Adult infusion 25 mcg/min (12/11/16 0944)  . propofol      Assessment: 60 yom on chronic xarelto for history of multiple PE's initially started on lovenox for anticoagulation while unable to take PO. Now s/p trach today and transitioning to IV heparin 4 hours post-procedure. CBC is WNL.   Goal of Therapy:  Heparin level 0.3-0.7 units/ml Monitor platelets by anticoagulation protocol: Yes   Plan:  Heparin gtt 1000 units/hr starting at 1600 today - RN to notify if any bleeding develops Check an 8 hr heparin level Daily heparin level and CBC F/u ability to restart xarelto  Susana Gripp, Drake Leach 12/11/2016,11:40 AM

## 2016-12-11 NOTE — Progress Notes (Signed)
eLink Physician Progress Note and Electrolyte Replacement  Patient Name: Paycen Casebolt DOB: 12/16/1955 MRN: [id number]  Date of Service  12/11/2016   HPI/Events of Note    Recent Labs Lab 12/08/16 0255 12/08/16 1030 12/08/16 1529 12/09/16 0533 12/10/16 0231 12/11/16 0245  NA 144 144 143 144 136 137  K <2.0* 2.4* 3.6 4.4 4.7 3.2*  CL 111 110 110 111 105 102  CO2 28 27 29 25 23 26   GLUCOSE 138* 87 92 73 58* 92  BUN <5* <5* <5* <5* <5* <5*  CREATININE <0.30* <0.30* <0.30* <0.30* <0.30* <0.30*  CALCIUM 9.2 9.1 8.9 9.1 8.9 9.4  MG 1.5*  --   --   --   --   --     CrCl cannot be calculated (This lab value cannot be used to calculate CrCl because it is not a number: <0.30).  Intake/Output      06/29 0701 - 06/30 0700   I.V. (mL/kg) 461.7 (7.6)   NG/GT 126   Total Intake(mL/kg) 587.7 (9.6)   Urine (mL/kg/hr) 2400 (1.6)   Total Output 2400   Net -1812.3        - I/O DETAILED x 24h    Total I/O In: 80 [NG/GT:80] Out: 700 [Urine:700] - I/O THIS SHIFT    ASSESSMENT Low K  eICURN Interventions  Replete kcl 40   ASSESSMENT: MAJOR ELECTROLYTE      Dr. Kalman Shan, M.D., [facility]C.P Pulmonary and Critical Care Medicine Staff Physician [facility] [place] [facility] Pager: [phone], If no answer or between  15:00h - 7:00h: call [phone]  12/11/2016 5:44 AM

## 2016-12-11 NOTE — Procedures (Signed)
Bronchoscopy Procedure Note George Evans [id number] November 29, 1955  Procedure: Bronchoscopy Indications: Obtain specimens for culture and/or other diagnostic studies and Remove secretions  Procedure Details Consent: Unable to obtain consent because of emergent medical necessity. Time Out: Verified patient identification, verified procedure, site/side was marked, verified correct patient position, special equipment/implants available, medications/allergies/relevent history reviewed, required imaging and test results available.  Performed  In preparation for procedure, patient was given 100% FiO2 and bronchoscope lubricated. Sedation: fent versed, prop  Airway entered and the following bronchi were examined: LUL, LLL and Bronchi.   Procedures performed: Brushings performed Bronchoscope removed.  , Patient placed back on 100% FiO2 at conclusion of procedure.    Evaluation Hemodynamic Status: BP stable throughout; O2 sats: stable throughout Patient's Current Condition: stable Specimens:  Sent purulent fluid Complications: No apparent complications Patient did tolerate procedure well.   Raylene Miyamoto. 12/11/2016   1. Sever distal collapse all lobes Left lower, upper div , LUL,, lingula, all suctioned clear 2. ETT was wnl 3. BAL LLL , thick white secretions  Improved BS and chest rise  Lavon Paganini. Titus Mould, MD, [place] Pgr: [place] [facility]

## 2016-12-11 NOTE — Procedures (Signed)
Bronchoscopy  for Percutaneous  Tracheostomy  Name: George Evans MRN: [id number] DOB: 04-19-1956 Procedure: Bronchoscopy for Percutaneous Tracheostomy Indications: Diagnostic evaluation of the airways In conjunction with: Dr. Tyson Alias   Procedure Details Consent: Risks of procedure as well as the alternatives and risks of each were explained to the (patient/caregiver).  Consent for procedure obtained. Time Out: Verified patient identification, verified procedure, site/side was marked, verified correct patient position, special equipment/implants available, medications/allergies/relevent history reviewed, required imaging and test results available.  Performed  In preparation for procedure, patient was given 100% FiO2 and bronchoscope lubricated. Sedation: Benzodiazepines and Etomidate  Airway entered and the following bronchi were examined: LLL.   Procedures performed: Endotracheal Tube retracted in 2 cm increments. Cannulation of airway observed. Dilation observed. Placement of trachel tube  observed . No overt complications. Bronchoscope removed.    Evaluation Hemodynamic Status: BP stable throughout; O2 sats: stable throughout Patient's Current Condition: stable Specimens:  None Complications: No apparent complications Patient did tolerate procedure well.   Brett Canales Vasili Fok ACNP [facility] PCCM Pager [phone] till 3 pm If no answer page [phone] 12/11/2016, 11:28 AM

## 2016-12-11 NOTE — Procedures (Signed)
Intubation Procedure Note George Evans [id number] 1956-03-03  Procedure: Intubation Indications: Prior to bronchoscopy / trach  Procedure Details Consent: Risks of procedure as well as the alternatives and risks of each were explained to the (patient/caregiver).  Consent for procedure obtained. Time Out: Verified patient identification, verified procedure, site/side was marked, verified correct patient position, special equipment/implants available, medications/allergies/relevent history reviewed, required imaging and test results available.  Performed  Maximum sterile technique was used including gloves, gown, hand hygiene and mask.  MAC and 4    Evaluation Hemodynamic Status: Transient hypotension treated with fluid; O2 sats: stable throughout Patient's Current Condition: stable Complications: No apparent complications Patient did tolerate procedure well. Chest X-ray ordered to verify placement.  CXR: pending.   George Evans 12/11/2016   Immediate after ett, no BS left,  Bronch planned now Concern no chest rise is collapse  Lavon Paganini. Titus Mould, MD, [place] Pgr: [place] [facility]

## 2016-12-11 NOTE — Progress Notes (Signed)
PT Cancellation Note  Patient Details Name: George Evans MRN: [id number] DOB: 1955/11/30   Cancelled Treatment:    Reason Eval/Treat Not Completed: Patient not medically ready Being prepared for tracheostomy placement. Will check back for initial PT evaluation as appropriate.   Berton Mount 12/11/2016, 9:32 AM  Charlsie Merles, PT, DPT    [phone] Weekend Pager

## 2016-12-11 NOTE — Procedures (Signed)
Name:  George Evans MRN:  [id number] DOB:  1955/07/04  OPERATIVE NOTE  Procedure:  Percutaneous tracheostomy.  Indications:  Ventilator-dependent respiratory failure.  Consent:  Procedure, alternatives, risks and benefits discussed with medical POA.  Questions answered.  Consent obtained.  Anesthesia:  Fent, versed, etomidate  Procedure summary:  Appropriate equipment was assembled.  The patient was identified as George Evans and safety timeout was performed. The patient was placed in supine position with a towel roll behind shoulder blades and neck extended.  Sterile technique was used. The patient's neck and upper chest were prepped using chlorhexidine / alcohol scrub and the field was draped in usual sterile fashion with full body drape. After the adequate sedation / anesthesia was achieved, attention was directed at the midline trachea, where the cricothyroid membrane was palpated. Approximately two fingerbreadths above the sternal notch, a verticle incision was created with a scalpel after local infiltration with 0.2% Lidocaine. Then, using Seldinger technique and a percutaneous tracheostomy set, the trachea was entered with a 14 gauge needle with an overlying sheath. This was all confirmed under direct visualization of a fiberoptic flexible bronchoscope. Entrance into the trachea was identified through the third tracheal ring interspace. Following this, a guidewire was inserted. The needle was removed, leaving the sheath and the guidewire intact. Next, the sheath was removed and a small dilator was inserted. The tracheal rings were then dilated. A #6 Shiley was then opened. The balloon was checked. It was placed over a tracheal dilator, which was then advanced over the guidewire and through the previously dilated tract. The Shiley tracheostomy tube was noted to pass in the trachea with little resistance. The guidewire and dilator tubes were removed from the trachea. An inner cannula was placed  through the tracheostomy tube. The tracheostomy was then secured at the anterior neck with 4 monofilament sutures. The oral endotracheal tube was removed and the ventilator was attached to the newly placed tracheostomy tube. Adequate tidal volumes were noted. The cuff was inflated and no evidence of air leak was noted. No evidence of bleeding was noted. At this point, the procedure was concluded. Post-procedure chest x-ray was ordered.  Complications:  No immediate complications were noted.  Hemodynamic parameters and oxygenation remained stable throughout the procedure.  Estimated blood loss:  Less then 5 mL.  Nelda Bucks., MD Pulmonary and Critical Care Medicine [facility] Pager: [phone]  12/11/2016, 11:28 AM   Should follow up in trach clinic Uncle pete [phone]

## 2016-12-11 NOTE — Progress Notes (Signed)
FMTS Social Note: Saw Mr. George Evans this morning. He states he is doing okay. Patient remained on 4L O2 by [place] overnight. G tube working well per [employer]. Plan for trach today. Appreciate excellent care by CCM team. Will be happy to resume care when patient is appropriate for transfer out of the ICU.  Willadean Carol, MD PGY-2

## 2016-12-12 LAB — GLUCOSE, CAPILLARY
GLUCOSE-CAPILLARY: 123 mg/dL — AB (ref 65–99)
GLUCOSE-CAPILLARY: 115 mg/dL — AB (ref 65–99)
Glucose-Capillary: 106 mg/dL — ABNORMAL HIGH (ref 65–99)
Glucose-Capillary: 123 mg/dL — ABNORMAL HIGH (ref 65–99)
Glucose-Capillary: 134 mg/dL — ABNORMAL HIGH (ref 65–99)
Glucose-Capillary: 96 mg/dL (ref 65–99)

## 2016-12-12 LAB — HEPARIN LEVEL (UNFRACTIONATED)
Heparin Unfractionated: 0.41 IU/mL (ref 0.30–0.70)
Heparin Unfractionated: 0.51 IU/mL (ref 0.30–0.70)

## 2016-12-12 MED ORDER — POTASSIUM CHLORIDE 20 MEQ/15ML (10%) PO SOLN
40.0000 meq | Freq: Once | ORAL | Status: AC
  Administered 2016-12-12: 40 meq
  Filled 2016-12-12: qty 30

## 2016-12-12 MED ORDER — DEXTROSE 5 % IV SOLN
1.0000 g | INTRAVENOUS | Status: AC
  Administered 2016-12-12 – 2016-12-18 (×7): 1 g via INTRAVENOUS
  Filled 2016-12-12 (×7): qty 10

## 2016-12-12 NOTE — Progress Notes (Signed)
ANTICOAGULATION CONSULT NOTE - Follow Up Consult  Pharmacy Consult for heparin Indication: hx multiple PE  Allergies  Allergen Reactions  . Coconut Oil Swelling    Patient Measurements: Height: 6' (182.9 cm) Weight: 134 lb 11.2 oz (61.1 kg) IBW/kg (Calculated) : 77.6 Heparin Dosing Weight: 61kg  Vital Signs: Temp: 98.3 F (36.8 C) (07/01 0848) Temp Source: Oral (07/01 0848) BP: 95/69 (07/01 1100) Pulse Rate: 120 (07/01 1100)  Labs:  Recent Labs  12/09/16 1123 12/10/16 0231 12/10/16 0849 12/11/16 0245 12/12/16 0012 12/12/16 0839  HGB  --   --  13.6  --   --   --   HCT  --   --  42.7  --   --   --   PLT  --   --  208  --   --   --   LABPROT 14.4  --   --   --   --   --   INR 1.12  --   --   --   --   --   HEPARINUNFRC  --   --   --   --  0.41 0.51  CREATININE  --  <0.30*  --  <0.30*  --   --     CrCl cannot be calculated (This lab value cannot be used to calculate CrCl because it is not a number: <0.30).   Medications:  Infusions:  . fentaNYL infusion INTRAVENOUS Stopped (12/12/16 0716)  . heparin 1,000 Units/hr (12/12/16 1100)  . phenylephrine (NEO-SYNEPHRINE) Adult infusion 10 mcg/min (12/12/16 1100)    Assessment: 60 yom on chronic xarelto for history of multiple PE's initially started on lovenox for anticoagulation while unable to take PO. Now s/p trach 6/30 and transitioned to IV heparin 4 hours post-procedure. CBC is WNL. Heparin level is at goal.   Goal of Therapy:  Heparin level 0.3-0.7 units/ml Monitor platelets by anticoagulation protocol: Yes   Plan:  Continue Heparin gtt 1000 units/hr  Daily heparin level and CBC F/u ability to restart xarelto  Equan Cogbill, Drake Leach 12/12/2016,11:15 AM

## 2016-12-12 NOTE — Progress Notes (Signed)
FPTS Interim Progress Note  S/p percutaneous trach placement on 6/30.  Appreciate care provided by CCM team.   Will resume care once he is out of the ICU.    Freddrick March, MD  12/12/2016, 8:21 AM PGY-2

## 2016-12-12 NOTE — Progress Notes (Signed)
CPT started on patient. Patient ask to stop therapy due to intolerance. Treatment lasted approx 1 minute.

## 2016-12-12 NOTE — Progress Notes (Signed)
ANTICOAGULATION CONSULT NOTE - Follow Up Consult  Pharmacy Consult for heparin Indication: h/o PEs  Labs:  Recent Labs  12/09/16 0533 12/09/16 1123 12/10/16 0231 12/10/16 0849 12/11/16 0245 12/12/16 0012  HGB 12.8*  --   --  13.6  --   --   HCT 40.6  --   --  42.7  --   --   PLT 210  --   --  208  --   --   LABPROT  --  14.4  --   --   --   --   INR  --  1.12  --   --   --   --   HEPARINUNFRC  --   --   --   --   --  0.41  CREATININE <0.30*  --  <0.30*  --  <0.30*  --     Assessment/Plan:  61yo male therapeutic on heparin after transitioning from LMWH. Will continue gtt at current rate and confirm stable with additional level.   Vernard Gambles, PharmD, BCPS  12/12/2016,1:09 AM

## 2016-12-12 NOTE — Progress Notes (Signed)
Name: George Evans MRN: [id number] DOB: 03/11/1956    ADMISSION DATE:  12/01/2016 CONSULTATION DATE:  6/25   REFERRING MD :  Dr. 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 . He was recently admitted to [facility] 6/16 > 6/19 after he was thought to have aspirated a pill. This was not found to be the case on CXR or bronchoscopy. Course complicated by acute on chronic respiratory failure secondary to excessive secretions and progression of MD. He was started on Trilogy Vent, Chest PT, and suctioning with improvement. Unable to be discharged on Trilogy so he was recommended nocturnal BiPAP, which he tolerated well and was discharged to [facility].  Since discharge he has been unable to use BiPAP due to some issue at facility. Initially he was doing OK, however, 6/25 he developed acute onset dyspnea , readmitted for difficulty clearing secretions   SIGNIFICANT EVENTS  6/16 > 6/19 admit for respiratory failure and ? Pill aspiration. 6/25 > admit with dyspnea 6/28 trach decided 6/30 trach placed  STUDIES: No c x r 7/1   SUBJECTIVE:   Weaning off vent and off neo  VITAL SIGNS: Temp:  [97.9 F (36.6 C)-99.1 F (37.3 C)] 98.3 F (36.8 C) (07/01 0848) Pulse Rate:  [50-132] 120 (07/01 1100) Resp:  [11-25] 15 (07/01 1100) BP: (78-133)/(42-88) 95/69 (07/01 1100) SpO2:  [96 %-100 %] 100 % (07/01 1100) FiO2 (%):  [40 %] 40 % (07/01 0901) Weight:  [134 lb 11.2 oz (61.1 kg)] 134 lb 11.2 oz (61.1 kg) (07/01 0600)  PHYSICAL EXAMINATION: General:  Frail thin male HEENT: Trach unremarkable WUJ:[id number]  Neuro: NM dz noted CV: HSR RRR ST on neo drip PULM: CTA on ps WG:NFAO, non-tender, bsx4 active  Extremities: warm/dry, - edema  Skin: no rashes or lesions    Recent Labs Lab 12/09/16 0533  12/10/16 0231 12/11/16 0245  NA 144 136 137  K 4.4 4.7 3.2*  CL 111 105 102  CO2 25 23 26   BUN <5* <5* <5*  CREATININE <0.30* <0.30* <0.30*  GLUCOSE 73 58* 92    Recent Labs Lab 11/26/2016 1715 12/09/16 0533 12/10/16 0849  HGB 16.6 12.8* 13.6  HCT 50.6 40.6 42.7  WBC 8.6 4.5 5.1  PLT 298 210 208     ASSESSMENT / PLAN:  Acute on chronic respiratory failure:with hypoventilation in the setting of progressive muscular dystrophy complicated by impaired clearance of respiratory secretions.  PLAN -  Continue mucinex, mucomyst, chest PT  Trached 6/30 Plan is to be on nocturnal vent Need to wean of neo drip De intensify care and move towards home vent support       H/o PE - on xarelto  - hold anticoagulation until after procedure then consider heparin short acting for 24 hours assessing any bleeding  Protein calorie Malnutrition: s/p PEG PLAN -  PEG, start feeds post op trach Transition to back to xarelto   Renal Lab Results  Component Value Date   CREATININE <0.30 (L) 12/11/2016   CREATININE <0.30 (L) 12/10/2016   CREATININE <0.30 (L) 12/09/2016    Recent Labs Lab 12/09/16 0533 12/10/16 0231 12/11/16 0245  K 4.4 4.7 3.2*   Hypokalemia   k supp bmet in am  caution low muslce mass crt prediction renal fxn   Ccm time 30 min  7/1 family updated  Brett Canales Minor ACNP Adolph Pollack PCCM Pager [phone] till 3 pm If no answer page [phone] 12/12/2016, 11:22 AM  STAFF NOTE: I, Rory Percy, MD FACP have personally reviewed patient's available data, including medical history, events of note, physical examination and test results as part of my evaluation. I have discussed with resident/NP and other care providers such as pharmacist, RN and RRT. In addition, I personally evaluated patient and elicited key findings of: awake, secretions improved, ronchi less left base, trach clean dry, on heparin without bleeding, no edema, low muscle mass, pcxr which I reviewed  shows resolving LLL ATX, trach wnl, he has required Neo for map support, have MAP 55 or sys 85 goal, wean neo to off as goal, if needed can bolus further, given secretions status and pairs on stain will add ctx, follow growth, continued nocturnal support mandatory, daytime TC, no cuff deflation with secretions status, feed peg, I updated pt, we did allow him to do PS 10 today prior to TC attempts The patient is critically ill with multiple organ systems failure and requires high complexity decision making for assessment and support, frequent evaluation and titration of therapies, application of advanced monitoring technologies and extensive interpretation of multiple databases.   Critical Care Time devoted to patient care services described in this note is 30 Minutes. This time reflects time of care of this signee: Rory Percy, MD FACP. This critical care time does not reflect procedure time, or teaching time or supervisory time of PA/NP/Med student/Med Resident etc but could involve care discussion time. Rest per NP/medical resident whose note is outlined above and that I agree with   Mcarthur Rossetti. Tyson Alias, MD, FACP Pgr: [phone] [facility] 12/12/2016 11:56 AM

## 2016-12-12 DEATH — deceased

## 2016-12-13 ENCOUNTER — Inpatient Hospital Stay (HOSPITAL_COMMUNITY): Payer: Medicare Other

## 2016-12-13 DIAGNOSIS — Z9289 Personal history of other medical treatment: Secondary | ICD-10-CM

## 2016-12-13 LAB — GLUCOSE, CAPILLARY
GLUCOSE-CAPILLARY: 102 mg/dL — AB (ref 65–99)
GLUCOSE-CAPILLARY: 106 mg/dL — AB (ref 65–99)
GLUCOSE-CAPILLARY: 133 mg/dL — AB (ref 65–99)
GLUCOSE-CAPILLARY: 76 mg/dL (ref 65–99)
Glucose-Capillary: 98 mg/dL (ref 65–99)
Glucose-Capillary: 108 mg/dL — ABNORMAL HIGH (ref 65–99)
Glucose-Capillary: 89 mg/dL (ref 65–99)

## 2016-12-13 LAB — BASIC METABOLIC PANEL
ANION GAP: 5 (ref 5–15)
BUN: <5 mg/dL — ABNORMAL LOW (ref 6–20)
CALCIUM: 8.9 mg/dL (ref 8.9–10.3)
CO2: 28 mmol/L (ref 22–32)
Chloride: 106 mmol/L (ref 101–111)
Glucose, Bld: 104 mg/dL — ABNORMAL HIGH (ref 65–99)
Potassium: 2.7 mmol/L — CL (ref 3.5–5.1)
SODIUM: 139 mmol/L (ref 135–145)

## 2016-12-13 LAB — CBC
HCT: 42.6 % (ref 39.0–52.0)
HEMATOCRIT: 42.1 % (ref 39.0–52.0)
Hemoglobin: 14 g/dL (ref 13.0–17.0)
Hemoglobin: 13.8 g/dL (ref 13.0–17.0)
MCH: 27.7 pg (ref 26.0–34.0)
MCH: 28 pg (ref 26.0–34.0)
MCHC: 32.9 g/dL (ref 30.0–36.0)
MCHC: 32.8 g/dL (ref 30.0–36.0)
MCV: 84.4 fL (ref 78.0–100.0)
MCV: 85.4 fL (ref 78.0–100.0)
PLATELETS: 185 10*3/uL (ref 150–400)
PLATELETS: 218 10*3/uL (ref 150–400)
RBC: 5.05 MIL/uL (ref 4.22–5.81)
RBC: 4.93 MIL/uL (ref 4.22–5.81)
RDW: 16.5 % — ABNORMAL HIGH (ref 11.5–15.5)
RDW: 16.7 % — AB (ref 11.5–15.5)
WBC: 7.3 10*3/uL (ref 4.0–10.5)
WBC: 8 10*3/uL (ref 4.0–10.5)

## 2016-12-13 LAB — HEPARIN LEVEL (UNFRACTIONATED): HEPARIN UNFRACTIONATED: 0.58 [IU]/mL (ref 0.30–0.70)

## 2016-12-13 MED ORDER — ACETYLCYSTEINE 20 % IN SOLN
4.0000 mL | Freq: Two times a day (BID) | RESPIRATORY_TRACT | Status: DC
  Administered 2016-12-13: 4 mL via RESPIRATORY_TRACT
  Filled 2016-12-13 (×2): qty 4

## 2016-12-13 MED ORDER — POTASSIUM CHLORIDE 10 MEQ/100ML IV SOLN
10.0000 meq | INTRAVENOUS | Status: AC
  Administered 2016-12-13 (×4): 10 meq via INTRAVENOUS
  Filled 2016-12-13 (×4): qty 100

## 2016-12-13 MED ORDER — CHLORHEXIDINE GLUCONATE 0.12% ORAL RINSE (MEDLINE KIT)
15.0000 mL | Freq: Two times a day (BID) | OROMUCOSAL | Status: DC
  Administered 2016-12-13 – 2016-12-18 (×10): 15 mL via OROMUCOSAL

## 2016-12-13 MED ORDER — ONDANSETRON HCL 4 MG/2ML IJ SOLN: 4.0000 mg | Freq: Three times a day (TID) | INTRAMUSCULAR | Status: DC | PRN

## 2016-12-13 MED ORDER — POTASSIUM CHLORIDE 20 MEQ/15ML (10%) PO SOLN
80.0000 meq | Freq: Once | ORAL | Status: AC
  Administered 2016-12-13: 80 meq
  Filled 2016-12-13: qty 60

## 2016-12-13 MED ORDER — ACETYLCYSTEINE 20 % IN SOLN
4.0000 mL | Freq: Two times a day (BID) | RESPIRATORY_TRACT | Status: DC
  Administered 2016-12-13 – 2016-12-16 (×6): 4 mL via RESPIRATORY_TRACT
  Filled 2016-12-13 (×6): qty 4

## 2016-12-13 MED ORDER — BISACODYL 10 MG RE SUPP
10.0000 mg | Freq: Once | RECTAL | Status: AC
  Administered 2016-12-13: 10 mg via RECTAL
  Filled 2016-12-13: qty 1

## 2016-12-13 MED ORDER — RIVAROXABAN 20 MG PO TABS
20.0000 mg | ORAL_TABLET | Freq: Every day | ORAL | Status: DC
  Administered 2016-12-13 – 2016-12-18 (×6): 20 mg
  Filled 2016-12-13 (×7): qty 1

## 2016-12-13 MED ORDER — ORAL CARE MOUTH RINSE
15.0000 mL | Freq: Four times a day (QID) | OROMUCOSAL | Status: DC
  Administered 2016-12-13 – 2016-12-18 (×16): 15 mL via OROMUCOSAL

## 2016-12-13 NOTE — Progress Notes (Signed)
Palliative Medicine RN Note: Chart entry noted that pt refused vent last night. Dr Tyson Alias spoke with him today. Mr. Mominee communicates with alphabet board.   Pt states trach is more comfortable today and that he will try again to wear the vent. Pt and family report that he slept no more than 25 minutes at a time last night. He states vent was uncomfortable because he felt it was blowing air into his belly, and he couldn't burp it out.   Plan for PMT member to f/u tomorrow am to see how he felt on the vent.  Margret Chance Damita Eppard, RN, BSN, [facility] 12/13/2016 11:58 AM Cell [phone] 8:00-4:00 Monday-Friday Office [phone]

## 2016-12-13 NOTE — Progress Notes (Signed)
CRITICAL VALUE ALERT  Critical Value: Potassium 2.7  Date & Time Notied: 12/13/16 0501  Provider Notified: Pola Corn Nurse Liz/Dr. Kendrick Fries  Orders Received/Actions taken: Potassium VIA tube and IV potassium

## 2016-12-13 NOTE — Progress Notes (Signed)
Name: George Evans MRN: [id number] DOB: 31-Dec-1955    ADMISSION DATE:  11/26/2016 CONSULTATION DATE:  6/25   REFERRING MD :  Dr. 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 . He was recently admitted to [facility] 6/16 > 6/19 after he was thought to have aspirated a pill. This was not found to be the case on CXR or bronchoscopy. Course complicated by acute on chronic respiratory failure secondary to excessive secretions and progression of MD. He was started on Trilogy Vent, Chest PT, and suctioning with improvement. Unable to be discharged on Trilogy so he was recommended nocturnal BiPAP, which he tolerated well and was discharged to [facility].  Since discharge he has been unable to use BiPAP due to some issue at facility. Initially he was doing OK, however, 6/25 he developed acute onset dyspnea , readmitted for difficulty clearing secretions   SIGNIFICANT EVENTS  6/16 > 6/19 admit for respiratory failure and ? Pill aspiration. 6/25 > admit with dyspnea 6/28 trach decided 6/30 trach placed 7/1 refused nocturnal vent  STUDIES: No c x r 7/1   SUBJECTIVE:   Off neo remains Refused nocturnal vent and other therapy Neg 417 cc  VITAL SIGNS: Temp:  [97.4 F (36.3 C)-98 F (36.7 C)] 97.6 F (36.4 C) (07/02 0820) Pulse Rate:  [76-140] 84 (07/02 1000) Resp:  [9-32] 18 (07/02 1000) BP: (80-138)/(64-91) 101/72 (07/02 1000) SpO2:  [93 %-100 %] 99 % (07/02 1000) FiO2 (%):  [28 %-40 %] 28 % (07/02 0812) Weight:  [59.4 kg (130 lb 15.3 oz)] 59.4 kg (130 lb 15.3 oz) (07/02 0500)  PHYSICAL EXAMINATION: General: awake, alert, cooperative, judgement intake  Neuro: weak diffuse HEENT: trach dry, no bleeding PULM:  LLL remains with aeration, secretions down CV: s1 s2 RR int tachy GI: soft, bs wnl, no  r Extremities: no edema      Recent Labs Lab 12/10/16 0231 12/11/16 0245 12/13/16 0349  NA 136 137 139  K 4.7 3.2* 2.7*  CL 105 102 106  CO2 23 26 28   BUN <5* <5* <5*  CREATININE <0.30* <0.30* <0.30*  GLUCOSE 58* 92 104*    Recent Labs Lab 12/10/16 0849 12/13/16 0349 12/13/16 0818  HGB 13.6 14.0 13.8  HCT 42.7 42.6 42.1  WBC 5.1 7.3 8.0  PLT 208 185 218     ASSESSMENT / PLAN:  Acute on chronic respiratory failure:with hypoventilation in the setting of progressive muscular dystrophy complicated by impaired clearance of respiratory secretions.  LLL ATX re occurs when off vent at night PLAN -  Continue mucinex, mucomyst, chest PT  Mandatory vent at night or ATX lLL worsen, no role bronch pcxr in am  Extend mucumyst Consider some vent time today with peep 8 TC today to liit No cuff down , no pmv Refusing therapy, will discuss with him further  H/o PE - on xarelto  - heparin drip, to transition to xarelto , follow crt  Protein calorie Malnutrition: s/p PEG PLAN -  PEG,feeding Transition to back to xarelto  Need BM, add mir, dulx supp  Renal Lab Results  Component Value Date   CREATININE <0.30 (L) 12/13/2016   CREATININE <0.30 (L) 12/11/2016   CREATININE <0.30 (L) 12/10/2016    Recent Labs Lab 12/10/16 0231 12/11/16 0245 12/13/16 0349  K 4.7 3.2*  2.7*   Hypokalemia 4 runs now, use peg in future  For k supp   ID R/o LL PNA Ctx, follow culture frmo bronch  Ccm time 30 min  7/1 family updated   Mcarthur Rossetti. Tyson Alias, MD, FACP Pgr: [phone] [facility] 12/13/2016 10:28 AM

## 2016-12-13 NOTE — Progress Notes (Signed)
FMTS Social Note:  Saw George Evans this morning.  He is recovering well from his trach tube placement on 6/30 and says the area is sore but reports no pain on his current pain regimen.  He has been receiving nutrition through his peg tube.  We will continue to follow and appreciate the excellent care given by the CCM team.  When patient is ready for step down unit, we will resume care.

## 2016-12-13 NOTE — Progress Notes (Signed)
ANTICOAGULATION CONSULT NOTE - Initial Consult  Pharmacy Consult for Xarelto Indication: History of PE  Allergies  Allergen Reactions  . Coconut Oil Swelling    Patient Measurements: Height: 6' (182.9 cm) Weight: 130 lb 15.3 oz (59.4 kg) IBW/kg (Calculated) : 77.6  Vital Signs: Temp: 97.5 F (36.4 C) (07/02 1148) Temp Source: Oral (07/02 1148) BP: 101/72 (07/02 1000) Pulse Rate: 84 (07/02 1000)  Labs:  Recent Labs  12/11/16 0245 12/12/16 0012 12/12/16 0839 12/13/16 0349 12/13/16 0818  HGB  --   --   --  14.0 13.8  HCT  --   --   --  42.6 42.1  PLT  --   --   --  185 218  HEPARINUNFRC  --  0.41 0.51 0.58  --   CREATININE <0.30*  --   --  <0.30*  --     CrCl cannot be calculated (This lab value cannot be used to calculate CrCl because it is not a number: <0.30).   Medical History: Past Medical History:  Diagnosis Date  . Abnormal posture   . Chronic viral hepatitis C (HCC)   . Constipation   . Difficulty walking   . Distal muscular dystrophy (HCC)   . 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 Major depressive disorder   . Muscle wasting   . Muscle weakness   . Other diseases of bronchus, not elsewhere classified (CODE)   . Peripheral vascular disease (HCC)   . Pressure ulcer of sacral region     Assessment: 61 year old male with muscular dystrophy and history of PE on Xarelto chronically prior to admission. Patient was change to heparin on admission and now to transition back to Xarelto.   SCr is difficult to assess due to very low muscle mass in setting of muscular dystrophy (<0.30). Good UOP at 0.9 cc/kg/hr.   H/H and Platelets are stable and within normal limits. Heparin level was therapeutic this AM. No bleeding reported on bedside rounds.   Goal of Therapy:  Monitor platelets by anticoagulation protocol: Yes   Plan:  Restart Xarelto 20mg  per tube daily. Discontinue Heparin at the same time as 1st dose of Xarelto given -- instructions  given to RN.  Monitor renal function and for signs/symptoms of bleeding.   Link Snuffer, PharmD, BCPS Clinical Pharmacist Clinical Phone 12/13/2016 until 3:30 PM - [phone] After hours, please call [phone] 12/13/2016,12:10 PM

## 2016-12-13 NOTE — Care Management Note (Signed)
Case Management Note  Patient Details  Name: George Evans MRN: [id number] Date of Birth: Oct 26, 1955  Subjective/Objective:         Pt admitted with increasing inability to handle secretions - from [facility]           Action/Plan:  PTA from [facility] - per CCM pt will need either triology  Vent (preferred) or Non invasive ventilation greater than BIPAP at night at facility.  CSW consulted and informed of PCCM request - CSW actively seeking placement of facility that can provide this equipment.     Expected Discharge Date:                  Expected Discharge Plan:  [place] (From [facility])  In-House Referral:  Clinical Social Work  Discharge planning Services  CM Consult  Post Acute Care Choice:    Choice offered to:     DME Arranged:    DME Agency:     HH Arranged:    HH Agency:     Status of Service:     If discussed at [employer], dates discussed:    Additional Comments: 12/13/2016  Pt decided to move forward with trach 6/30 with the understanding that placement will very likely be out of state.  CM spoke with wife regarding planning for placement as per wife pt can not return home.  Pt has been in SNF for greater than 4 years - and has been basically total care for 5 years and wife states she can not take care of pt in the home.  CSW aware of placement need  12/09/16 CSW and CM met with wife and pt at bedside and explained barriers regarding placement with ventilator requirements.   Concerns were raised with defective BIPAP at [place] following up to ensure properly working equipment. Both wife and pt are adamant regarding being placed local.  Wife and pt wish to return to [place] on BIPAP.   Per attending the plan is; pt is get PEG today in IR and then discharge back to SNF with BIPAP.  Per attending pt needs either triology or trach and noctural ventilation.  Complex case - if pt is to discharge back to SNF with any needs of  ventilation (including trilogy) will likely need to be placed out of state.  Per attending pt is not appropriate at this time for [facility] and would not pursue LTACH referral until post trach and weaning attempts are made -  CM also feels that pt is not yet appropriate for [facility] referral and communicated that if at some point  [facility] referral is deemed appropriate but denied by insurance pt would again likely be placed out of state.  LTACH liaison consulted regarding LTACH approval via insurance - at this point is unknown as it would depend on weaning and acuity.  Pt has stated that under no circumstances does he want to go out of state and has therefore elected to go back to SNF with BIPAP.  CSW actively working on case for possible discharge options in state  Maryclare Labrador, [place] 12/13/2016, 2:35 PM

## 2016-12-13 NOTE — Consult Note (Signed)
SLP Cancellation Note  Patient Details Name: George Evans MRN: [id number] DOB: 1956-03-25   Cancelled treatment:       Reason Eval/Treat Not Completed: Medical issues which prohibited therapy. Consult with MD regarding appropriateness for PMSV. Trach placed 12/11/16, and secretion management necessitates cuff inflation at all times. ST available when pt ready for PMSV trials. Will await MD direction.  Caylee Vlachos B. [place], [facility], CCC-SLP [phone]  Leigh Aurora 12/13/2016, 12:16 PM

## 2016-12-13 NOTE — Progress Notes (Signed)
PT Cancellation Note  Patient Details Name: George Evans MRN: [id number] DOB: [birth year]   Cancelled Treatment:    Reason Eval/Treat Not Completed: PT screened, no needs identified, will sign off. Pt dependent with all mobility at baseline. Will need mechanical lift for any OOB.    [name] 12/13/2016, 11:54 AM  [name] PT [phone]

## 2016-12-13 NOTE — Progress Notes (Signed)
eLink Physician-Brief Progress Note Patient Name: George Evans DOB: 1956/05/25 MRN: [id number]   Date of Service  12/13/2016  HPI/Events of Note  hypokalemia  eICU Interventions  Replaced IV and per peg     Intervention Category Intermediate Interventions: Electrolyte abnormality - evaluation and management  Max Fickle 12/13/2016, 5:41 AM

## 2016-12-14 ENCOUNTER — Inpatient Hospital Stay (HOSPITAL_COMMUNITY): Payer: Medicare Other

## 2016-12-14 DIAGNOSIS — J189 Pneumonia, unspecified organism: Secondary | ICD-10-CM

## 2016-12-14 LAB — CBC
HCT: 41.3 % (ref 39.0–52.0)
Hemoglobin: 13.7 g/dL (ref 13.0–17.0)
MCH: 28 pg (ref 26.0–34.0)
MCHC: 33.2 g/dL (ref 30.0–36.0)
MCV: 84.5 fL (ref 78.0–100.0)
PLATELETS: 181 10*3/uL (ref 150–400)
RBC: 4.89 MIL/uL (ref 4.22–5.81)
RDW: 16.6 % — ABNORMAL HIGH (ref 11.5–15.5)
WBC: 6.3 10*3/uL (ref 4.0–10.5)

## 2016-12-14 LAB — GLUCOSE, CAPILLARY
GLUCOSE-CAPILLARY: 101 mg/dL — AB (ref 65–99)
GLUCOSE-CAPILLARY: 116 mg/dL — AB (ref 65–99)
GLUCOSE-CAPILLARY: 90 mg/dL (ref 65–99)
GLUCOSE-CAPILLARY: 91 mg/dL (ref 65–99)
Glucose-Capillary: 105 mg/dL — ABNORMAL HIGH (ref 65–99)
Glucose-Capillary: 105 mg/dL — ABNORMAL HIGH (ref 65–99)

## 2016-12-14 LAB — BASIC METABOLIC PANEL
Anion gap: 7 (ref 5–15)
BUN: <5 mg/dL — ABNORMAL LOW (ref 6–20)
CHLORIDE: 104 mmol/L (ref 101–111)
CO2: 25 mmol/L (ref 22–32)
Calcium: 9.1 mg/dL (ref 8.9–10.3)
Glucose, Bld: 101 mg/dL — ABNORMAL HIGH (ref 65–99)
POTASSIUM: 3.6 mmol/L (ref 3.5–5.1)
SODIUM: 136 mmol/L (ref 135–145)

## 2016-12-14 LAB — CULTURE, BAL-QUANTITATIVE W GRAM STAIN: Culture: 30000 — AB

## 2016-12-14 LAB — CULTURE, BAL-QUANTITATIVE

## 2016-12-14 LAB — LACTIC ACID, PLASMA: Lactic Acid, Venous: 1.7 mmol/L (ref 0.5–1.9)

## 2016-12-14 MED ORDER — SODIUM CHLORIDE 0.9 % IV BOLUS (SEPSIS)
500.0000 mL | Freq: Once | INTRAVENOUS | Status: AC
  Administered 2016-12-14: 500 mL via INTRAVENOUS

## 2016-12-14 MED ORDER — DOCUSATE SODIUM 50 MG/5ML PO LIQD
100.0000 mg | Freq: Every day | ORAL | Status: DC
  Administered 2016-12-14 – 2016-12-18 (×5): 100 mg
  Filled 2016-12-14 (×5): qty 10

## 2016-12-14 MED ORDER — SODIUM CHLORIDE 0.9 % IV BOLUS (SEPSIS): 1000.0000 mL | Freq: Once | INTRAVENOUS | Status: DC

## 2016-12-14 MED ORDER — ALPRAZOLAM 0.25 MG PO TABS
0.2500 mg | ORAL_TABLET | Freq: Once | ORAL | Status: AC
  Administered 2016-12-14: 0.25 mg via ORAL
  Filled 2016-12-14: qty 1

## 2016-12-14 MED ORDER — SENNOSIDES 8.8 MG/5ML PO SYRP
5.0000 mL | ORAL_SOLUTION | Freq: Every day | ORAL | Status: DC
  Administered 2016-12-14 – 2016-12-16 (×2): 5 mL
  Filled 2016-12-14 (×6): qty 5

## 2016-12-14 MED ORDER — KETOROLAC TROMETHAMINE 15 MG/ML IJ SOLN
15.0000 mg | Freq: Four times a day (QID) | INTRAMUSCULAR | Status: AC | PRN
  Administered 2016-12-14: 15 mg via INTRAVENOUS
  Filled 2016-12-14 (×2): qty 1

## 2016-12-14 NOTE — Progress Notes (Signed)
OT Cancellation Note  Patient Details Name: George Evans MRN: [id number] DOB: 05/12/1956   Cancelled Treatment:    Reason Eval/Treat Not Completed: OT screened, no needs identified, will sign off. At baseline in ADL.  Evern Bio 12/14/2016, 8:19 AM  [phone]

## 2016-12-14 NOTE — Progress Notes (Signed)
Wasted 225cc of fentanyl in the sink with penny shelton. Huntley Estelle E, RN 12/14/2016 2:54 PM

## 2016-12-14 NOTE — Progress Notes (Signed)
Pt's b/p 70/50's. Pt alert and orient, informed Dr. Kendrick Fries, orders written to give pt  NS bolus 500cc's and to continue to monitor. P. Pt's  b/p remains 70/50's. MD ordered CBC, BMP and LA. Pt's remains Alert and orient and voice no complaints at this time.

## 2016-12-14 NOTE — Progress Notes (Signed)
CSW following to facilitate discharge planning to vent SNF. CSW faxed out referral to vent SNF facilities ([facility], [facility], [facility], [facility], and [facility]). CSW contacted by [facility] with bed offer for Thursday. CSW informed patient's wife, Marylu Lund, and sister at bedside.  Patient's wife requested CSW to also check on bed offer at [facility]. Patient's wife indicated concern that [facility] facility was in a [place] part of town. CSW contacted [facility] to discuss referral; will call back.   CSW called patient's wife back to inform her. Patient's wife indicated she was at [facility] to check out the facility.   CSW will continue to follow to facilitate discharge when ready.  Blenda Nicely, [place] Clinical Social Worker [phone]

## 2016-12-14 NOTE — Telephone Encounter (Signed)
Routing back to MB as FYI

## 2016-12-14 NOTE — Care Management Note (Addendum)
Case Management Note  Patient Details  Name: George Evans MRN: [id number] Date of Birth: May 25, 1956  Subjective/Objective:         Pt admitted with increasing inability to handle secretions - from [facility]           Action/Plan:  PTA from [facility] - per CCM pt will need either triology  Vent (preferred) or Non invasive ventilation greater than BIPAP at night at facility.  CSW consulted and informed of PCCM request - CSW actively seeking placement of facility that can provide this equipment.     Expected Discharge Date:                  Expected Discharge Plan:  [place] (From [facility])  In-House Referral:  Clinical Social Work  Discharge planning Services  CM Consult  Post Acute Care Choice:    Choice offered to:     DME Arranged:    DME Agency:     HH Arranged:    HH Agency:     Status of Service:     If discussed at [employer], dates discussed:    Additional Comments: 12/14/2016  Discussed in LOS 12/14/16 - pt remains appropriate for continued stay.  Per physician advisor - pt is not appropriate for [facility] referral.  CSW actively following for placement  12/13/16 Pt decided to move forward with trach 6/30 with the understanding that placement will very likely be out of state.  CM spoke with wife regarding planning for placement as per wife pt can not return home.  Pt has been in SNF for greater than 4 years - and has been basically total care for 5 years and wife states she can not take care of pt in the home.  CSW aware of placement need  12/09/16 CSW and CM met with wife and pt at bedside and explained barriers regarding placement with ventilator requirements.   Concerns were raised with defective BIPAP at [place] following up to ensure properly working equipment. Both wife and pt are adamant regarding being placed local.  Wife and pt wish to return to [place] on BIPAP.   Per attending the plan is; pt is get PEG today in IR and  then discharge back to SNF with BIPAP.  Per attending pt needs either triology or trach and noctural ventilation.  Complex case - if pt is to discharge back to SNF with any needs of ventilation (including trilogy) will likely need to be placed out of state.  Per attending pt is not appropriate at this time for [facility] and would not pursue LTACH referral until post trach and weaning attempts are made -  CM also feels that pt is not yet appropriate for [facility] referral and communicated that if at some point  [facility] referral is deemed appropriate but denied by insurance pt would again likely be placed out of state.  LTACH liaison consulted regarding LTACH approval via insurance - at this point is unknown as it would depend on weaning and acuity.  Pt has stated that under no circumstances does he want to go out of state and has therefore elected to go back to SNF with BIPAP.  CSW actively working on case for possible discharge options in state  Maryclare Labrador, [place] 12/14/2016, 10:45 AM

## 2016-12-14 NOTE — Progress Notes (Signed)
Name: George Evans MRN: [id number] DOB: Sep 03, 1955    ADMISSION DATE:  12-15-2016 CONSULTATION DATE:  6/25   REFERRING MD :  Dr. 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 . He was recently admitted to [facility] 6/16 > 6/19 after he was thought to have aspirated a pill. This was not found to be the case on CXR or bronchoscopy. Course complicated by acute on chronic respiratory failure secondary to excessive secretions and progression of MD. He was started on Trilogy Vent, Chest PT, and suctioning with improvement. Unable to be discharged on Trilogy so he was recommended nocturnal BiPAP, which he tolerated well and was discharged to [facility].  Since discharge he has been unable to use BiPAP due to some issue at facility. Initially he was doing OK, however, 6/25 he developed acute onset dyspnea , readmitted for difficulty clearing secretions   SIGNIFICANT EVENTS  6/16 > 6/19 admit for respiratory failure and ? Pill aspiration. 6/25 > admit with dyspnea 6/28 trach decided 6/30 trach placed 7/1 refused nocturnal vent 7/2 am hours, hypotension, bolus  STUDIES: No c x r 7/1   SUBJECTIVE:   Hypotension, bolus  VITAL SIGNS: Temp:  [97.5 F (36.4 C)-98.2 F (36.8 C)] 97.7 F (36.5 C) (07/03 0838) Pulse Rate:  [67-123] 113 (07/03 0839) Resp:  [11-26] 21 (07/03 0839) BP: (64-143)/(47-115) 68/55 (07/03 0800) SpO2:  [93 %-100 %] 100 % (07/03 0839) FiO2 (%):  [40 %] 40 % (07/03 0839) Weight:  [59.1 kg (130 lb 4.7 oz)] 59.1 kg (130 lb 4.7 oz) (07/03 0500)  PHYSICAL EXAMINATION: General: awake, alert Neuro: nonfocal, appropriate, ready to accept treatments HEENT: trach dry PULM: slight coarse LLL CV: s1 s2 RRR no  GI: soft, bs wnl, no  Extremities: no edema, loss of muslce mass       Recent  Labs Lab 12/11/16 0245 12/13/16 0349 12/14/16 0147  NA 137 139 136  K 3.2* 2.7* 3.6  CL 102 106 104  CO2 26 28 25   BUN <5* <5* <5*  CREATININE <0.30* <0.30* <0.30*  GLUCOSE 92 104* 101*    Recent Labs Lab 12/13/16 0349 12/13/16 0818 12/14/16 0147  HGB 14.0 13.8 13.7  HCT 42.6 42.1 41.3  WBC 7.3 8.0 6.3  PLT 185 218 181     ASSESSMENT / PLAN:  Acute on chronic respiratory failure:with hypoventilation in the setting of progressive muscular dystrophy complicated by impaired clearance of respiratory secretions.  LLL ATX re occurs when off vent at night PLAN -  pcxr now, re assess atx pcxr in am  To trach collar this am , appears sensitive to pos pressure with current volume status also When secretions reduce then PMV, cuff down - NOT there yet Nocturnal vent to slight lower TV  Did better, may need to alter Insp Time  H/o PE - on xarelto  - heparin drip, to transition to xarelto , follow crt  Protein calorie Malnutrition: s/p PEG PLAN -  PEG,feeding Got MIr, had BM Continued mir Renal Lab Results  Component Value Date   CREATININE <0.30 (L) 12/14/2016   CREATININE <0.30 (L) 12/13/2016   CREATININE <0.30 (L) 12/11/2016    Recent Labs Lab 12/11/16 0245 12/13/16 0349 12/14/16 0147  K 3.2* 2.7* 3.6   Hypotension Bolus responded Low threshold to rpeeat  ID R/o LL PNA  Remains culture neg, complete course ctx, 7 days   Hypotension related to pos pressure, concern cuff inaccurate low muscle mass Ensure correct cuff Lactic re assuring NO ROLE PRESSORS, is inaccurate and asymptomatic  Await sdu bed I updated family an dpt   Mcarthur Rossetti. Tyson Alias, MD, FACP Pgr: [phone] [facility] 12/14/2016 8:53 AM

## 2016-12-14 NOTE — Progress Notes (Signed)
Family Medicine Teaching Service Daily Progress Note Intern Pager: [phone]  Patient name: George Evans Medical record number: [id number] Date of birth: 1956-02-01 Age: 61 y.o. Gender: male  Primary Care Provider: York Spaniel, MD Consultants: CCM, SLP Code Status: Full   Pt Overview and Major Events to Date:  George Evans is a 61 yo male presenting with SOB. Patient was admitted to [facility] on 6/25. Has been followed by CCM for care.   Assessment and Plan:  Suspected pneumonia Patient placed on ceftriaxone 1g, day 3.  -continue for total of 7 doses   Acute Respiratory failure 2/2 mucus plugging  Patient follows w/ Pulmonologist Dr. Isaiah Serge. Presents with IWOB and inability to handle secretions, intermittently requiring BiPAP in ED. Patient was not able to get Trilogy ventilator at SNF and nightly BiPAP has not been functioning appropriately. Patient currently has tracheostomy. SpO2 currently 99% on 6L/min O2. Respiratory rate 20. DG Chest Portable 1 view on 12/14/16 showed haziness on left lung base consistent with atelectasis, pneumonia, and possibly small left pleural effusion with no change in position of trach, improved from previous CXR.  -Continue trach care and flushing for relief from mucus   -Continue guaifenesin 100 mg/27mL solution 100 mg prn and mucomyst 4 mL -continuous pulse ox  -consider palliative consult   Limb-girdle muscular dystrophy.  Follows with Neurologist Dr. Anne Hahn. Progressive weakness resulting in swallowing and breathing difficulties at this time.  - Senokot daily    Tachycardia HR currently 113. RRR. BP 102/73. EKG in ED showed sinus tachycardia. Likely due to respiratory distress. Possibly due to dehydration. TSH within normal limits at 0.491 -monitor on telemetry  -1000 mL normal saline fluid bolus   Hx of PEs -Continue Xarelto anticoagulation   Hypokalemia-resolved K currently 3.6, improved from 2.7 on 12/13/16 -Continue to monitor    Protein-calorie Malnutrition  PEG tube placed on 6/28.  -Continue nutrition through peg tube  PPx: xarelto   Disposition: Patient is currently improving and is being moved to step down   Subjective:  George Evans is a 61 yo male presenting for SOB. He has a PMH significant for limb-girdle muscular dystrophy, multiple PEs, and previously treated hepatitis C. Patient states that he is doing better. Patient's sister reports he is breathing well but attests to significant mucus production. Patient's nurse states that flushing twice has been improving condition. Patient's sister states that mucus production is his primary concern at this time.   Objective: Temp:  [97.5 F (36.4 C)-98.2 F (36.8 C)] 97.7 F (36.5 C) (07/03 0838) Pulse Rate:  [67-123] 113 (07/03 0839) Resp:  [11-26] 21 (07/03 0839) BP: (64-143)/(47-115) 68/55 (07/03 0800) SpO2:  [93 %-100 %] 100 % (07/03 0839) FiO2 (%):  [40 %] 40 % (07/03 0839) Weight:  [130 lb 4.7 oz (59.1 kg)] 130 lb 4.7 oz (59.1 kg) (07/03 0500) Physical Exam: General: NAD, awake and alert, thin chronically ill male sitting up in bed with trach in place Cardiovascular: tachycardic, RRR, no MRG, 2+ pulses bilaterally in upper extremities  Respiratory: course breath sounds bilaterally  Abdomen: soft, non tender, non distended  Extremities: thin in all four extremities  Laboratory:  Recent Labs Lab 12/13/16 0349 12/13/16 0818 12/14/16 0147  WBC 7.3 8.0 6.3  HGB 14.0 13.8 13.7  HCT 42.6 42.1 41.3  PLT 185 218 181    Recent Labs Lab 12/11/16 0245 12/13/16 0349 12/14/16 0147  NA 137 139 136  K 3.2* 2.7* 3.6  CL 102 106 104  CO2 26  28 25  BUN <5* <5* <5*  CREATININE <0.30* <0.30* <0.30*  CALCIUM 9.4 8.9 9.1  GLUCOSE 92 104* 101*    Imaging/Diagnostic Tests: DG Chest Port 1 View CLINICAL DATA:  History of pneumonia, evaluate tracheostomy position EXAM: PORTABLE CHEST 1 VIEW COMPARISON:  Portable chest x-ray of  12/13/2016 FINDINGS: There is persistent haziness at the left lung base suspicious for atelectasis/ pneumonia and possibly a small left pleural effusion. Minimal right basilar atelectasis remains. Mediastinal and hilar contours are unremarkable. Heart size is stable. Tracheostomy appears unchanged in position. No bony abnormality is seen. IMPRESSION: 1. Persistent haziness at the left lung base most consistent with atelectasis, pneumonia, and possibly a small left pleural effusion. 2. No apparent change in position of tracheostomy. Electronically Signed   By: Dwyane Dee M.D.   On: 12/14/2016 09:16  DG Chest Port 1 View CLINICAL DATA:  Respiratory failure. EXAM: PORTABLE CHEST 1 VIEW COMPARISON:  12/11/2016. FINDINGS: Tracheostomy tube in stable position. Heart size normal. Progressive left lower lobe infiltrate. Mild bibasilar atelectasis. Small left pleural effusion. No pneumothorax. IMPRESSION: 1. Tracheostomy tube in stable position. 2. Progressive left lower lobe infiltrate. Mild bibasilar atelectasis Electronically Signed   By: Maisie Fus  Register   On: 12/13/2016 06:32  DG Chest Port 1 View CLINICAL DATA:  Acute respiratory failure.  EXAM: PORTABLE CHEST 1 VIEW COMPARISON:  12/11/2016 FINDINGS: Tracheostomy tube terminates 5.5 cm superior to the carina. Cardiomediastinal silhouette is normal. Mediastinal contours appear intact. There is no evidence of pneumothorax. Mild volume loss in the left lower lung, incompletely evaluated as the left hemidiaphragm is excluded by collimation. Osseous structures are without acute abnormality. Soft tissues are grossly normal. IMPRESSION: Tracheostomy tube terminates 5.5 cm superior to the carina. Mild volume loss in the left lower lobe, incompletely evaluated as the left hemidiaphragm is excluded by collimation. Electronically Signed   By: Ted Mcalpine M.D.   On: 12/11/2016 12:06  DG Chest Port 1 View CLINICAL  DATA:  Intubation EXAM: PORTABLE CHEST 1 VIEW COMPARISON:  11/23/2016 FINDINGS: Endotracheal tube tip projects 3.5 cm about the carina. There is opacity at the left lung base obscuring the left hemidiaphragm increased from the prior exam. This is consistent with pneumonia. Remainder of the lungs is clear. Probable small left effusion. No right pleural effusion. No pneumothorax. Heart, mediastinum and hila are unremarkable. IMPRESSION: 1. Well-positioned endotracheal tube, tip projecting 3.5 cm about the carina. 2. Increased left lower lobe opacity consistent with pneumonia. Probable small left pleural effusion. Electronically Signed   By: Amie Portland M.D.   On: 12/11/2016 10:05  IR Gastrostomy Tube Mod Sed INDICATION: History of muscle dystrophy, now with progressive dysphagia. Please perform percutaneous gastrostomy tube placement for enteric nutrition supplementation. EXAM: PULL TROUGH GASTROSTOMY TUBE PLACEMENT COMPARISON:  CT the abdomen pelvis - 12/08/2016 MEDICATIONS: Ancef 2 gm IV; Antibiotics were administered within 1 hour of the procedure. Glucagon 1 mg IV CONTRAST:  20 cc Isovue-300 administered into the gastric lumen. ANESTHESIA/SEDATION: Moderate (conscious) sedation was employed during this procedure. A total of Versed 1 mg and Fentanyl 75 mcg was administered intravenously. Moderate Sedation Time: 15 minutes. The patient's level of consciousness and vital signs were monitored continuously by radiology nursing throughout the procedure under my direct supervision. FLUOROSCOPY TIME:  4 minutes 36 seconds (51 mGy) COMPLICATIONS: None immediate. PROCEDURE: Informed written consent was obtained from the patient following explanation of the procedure, risks, benefits and alternatives. A time out was performed prior to the initiation of the procedure.  Ultrasound scanning was performed to demarcate the edge of the left lobe of the liver. Maximal barrier  sterile technique utilized including caps, mask, sterile gowns, sterile gloves, large sterile drape, hand hygiene and Betadine prep. The left upper quadrant was sterilely prepped and draped. An oral gastric catheter was inserted into the stomach under fluoroscopy. The existing nasogastric feeding tube was removed. The left costal margin and air / barium opacified transverse colon were identified and avoided. Air was injected into the stomach for insufflation and visualization under fluoroscopy. Under sterile conditions a 17 gauge trocar needle was utilized to access the stomach percutaneously beneath the left subcostal margin after the overlying soft tissues were anesthetized with 1% Lidocaine with epinephrine. Needle position was confirmed within the stomach with aspiration of air and injection of small amount of contrast. A single T tack was deployed for gastropexy. Over an Amplatz guide wire, a 9-French sheath was inserted into the stomach. A snare device was utilized to capture the oral gastric catheter. The snare device was pulled retrograde from the stomach up the esophagus and out the oropharynx. The 20-French pull-through gastrostomy was connected to the snare device and pulled antegrade through the oropharynx down the esophagus into the stomach and then through the percutaneous tract external to the patient. The gastrostomy was assembled externally. Contrast injection confirms position in the stomach. Several spot radiographic images were obtained in various obliquities for documentation. The patient tolerated procedure well without immediate post procedural complication. FINDINGS: After successful fluoroscopic guided placement, the gastrostomy tube is appropriately positioned with internal disc against the ventral aspect of the gastric lumen. IMPRESSION: Successful fluoroscopic insertion of a 20-French pull-through gastrostomy tube. The gastrostomy may be used immediately  for medication administration and in 24 hrs for the initiation of feeds. Electronically Signed   By: Simonne Come M.D.   On: 12/09/2016 17:31  CT Abdomen WO Contrast CLINICAL DATA:  61 year old male with dysphagia EXAM: CT ABDOMEN WITHOUT CONTRAST TECHNIQUE: Multidetector CT imaging of the abdomen was performed following the standard protocol without IV contrast. COMPARISON:  None. FINDINGS: Lower chest: Chronic elevation of the left hemidiaphragm with associated atelectasis and bronchiolectasis. Trace dependent atelectasis in the right lower lobe. No focal airspace consolidation. The heart is normal in size. No pericardial effusion. Unremarkable distal thoracic esophagus. Hepatobiliary: Normal hepatic contour and morphology. No discrete hepatic lesions. Normal appearance of the gallbladder. No intra or extrahepatic biliary ductal dilatation. Pancreas: Unremarkable. No pancreatic ductal dilatation or surrounding inflammatory changes. Spleen: Normal in size without focal abnormality. Adrenals/Urinary Tract: Normal adrenal glands bilaterally. At least 3 focal radio opacities are present within the right renal collecting system consistent with nonobstructing nephrolithiasis. The largest stone measures 4 mm. No definite nephrolithiasis on the left. No evidence of hydronephrosis. Stomach/Bowel: High density contrast is present within the colon, likely related to the patient's recent modified barium swallow. The stomach and visualized small bowel are unremarkable. No focal bowel wall thickening or evidence of obstruction. No free fluid. Vascular/Lymphatic: Very mild atherosclerotic calcifications in the abdominal aorta. No evidence of aneurysm. No suspicious adenopathy. Other: Subcutaneous emphysema in the left lower quadrant anterior abdominal wall likely related to subcutaneous injections. Musculoskeletal: Severe fatty atrophy of all visible muscles. No acute osseous  abnormality. IMPRESSION: 1. Nonobstructing right-sided nephrolithiasis. 2. Chronic elevation of the left hemidiaphragm with associated left lower lobe atelectasis and bronchiolectasis 3. The recently ingested barium contrast is identified within the colon. Unremarkable imaged stomach and small bowel. 4.  Aortic Atherosclerosis (ICD10-170.0) 5. Severe  fatty atrophy of all visible muscle belly is consistent with the clinical history of muscular dystrophy. Electronically Signed   By: Malachy Moan M.D.   On: 12/08/2016 19:14  DG Chest 1 View CLINICAL DATA:  Shortness of breath and cough EXAM: CHEST 1 VIEW COMPARISON:  Chest radiograph 11/27/2016 FINDINGS: Unchanged elevation of the left hemidiaphragm with associated left basilar atelectasis. Lungs are otherwise clear. Normal cardiomediastinal contours. IMPRESSION: Unchanged left hemidiaphragmatic elevation and associated atelectasis. Electronically Signed   By: Deatra Robinson M.D.   On: 12-25-16 13:46  DG Chest 2 View CLINICAL DATA:  Aspirated a pill. EXAM: CHEST  2 VIEW COMPARISON:  None. FINDINGS: Low volume chest with streaky density over the bases. There is mild elevation of the left diaphragm. No airspace disease, edema, effusion, or pneumothorax. Normal heart size and mediastinal contours. IMPRESSION: Atelectasis at the left more than right base. No visible pneumonitis or air leak.Electronically Signed   By: Marnee Spring M.D.   On: 11/27/2016 14:18  Oralia Manis, DO 12/14/2016, 8:46 AM PGY-1, [facility] FPTS Intern pager: [phone], text pages welcome

## 2016-12-14 NOTE — Telephone Encounter (Signed)
The inpatient service will work on it. Thanks  Chilton Greathouse MD

## 2016-12-14 NOTE — Progress Notes (Signed)
eLink Physician-Brief Progress Note Patient Name: George Evans DOB: Mar 09, 1956 MRN: [id number]   Date of Service  12/14/2016  HPI/Events of Note  Persistently hypotensive, suspect related to positive pressure on vent Resting comfortably on camera check  eICU Interventions  Send AM labs now, add lactic acid     Intervention Category Major Interventions: Hypotension - evaluation and management  Max Fickle 12/14/2016, 1:18 AM

## 2016-12-14 NOTE — NC FL2 (Signed)
[place] LEVEL OF CARE SCREENING TOOL     IDENTIFICATION  Patient Name: George Evans Birthdate: [birth year] Sex: male Admission Date (Current Location): 11/27/2016  [facility] and [place] Number:  Herbalist and Address:  [facility] [place] [address], [place], [place] [address]      Provider Number: [id number]  Attending Physician Name and Address:  Lind Covert, MD  Relative Name and Phone Number:       Current Level of Care: Hospital Recommended Level of Care: [place] Prior Approval Number:    Date Approved/Denied:   PASRR Number: [id number]  Discharge Plan: SNF    Current Diagnoses: Patient Active Problem List   Diagnosis Date Noted  . History of ETT   . Acute respiratory distress   . At high risk for aspiration   . Hypokalemia   . Moderate protein-calorie malnutrition ([place])   . Dysphagia   . Palliative care by specialist   . Goals of care, counseling/discussion   . SOB (shortness of breath)   . Acute respiratory failure ([place]) 11/13/2016  . 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 MS (multiple sclerosis) ([place])   . Aspiration into airway 11/27/2016  . Choking   . Muscular weakness   . Respiratory failure, chronic neuromuscular ([place]) 09/14/2016  . 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 Limb-girdle muscular dystrophy ([place]) 08/19/2016    Orientation RESPIRATION BLADDER Height & Weight     Self, Time, Situation, Place  Tracheostomy, Vent (trach [place] 28m cuff at 28% during the day, vent at 40% during the night) Incontinent, External catheter Weight: (!) 313 lb (142 kg) Height:  6' (182.9 cm)  BEHAVIORAL SYMPTOMS/MOOD NEUROLOGICAL BOWEL NUTRITION STATUS      Continent Feeding tube (PEG placed 6/28)  AMBULATORY STATUS COMMUNICATION OF NEEDS Skin   Total Care Verbally PU Stage and Appropriate Care PU Stage 1 Dressing:  (PRN)                   Personal Care Assistance Level of Assistance  Bathing, Dressing, Total care Bathing Assistance: Maximum assistance   Dressing Assistance: Maximum assistance Total Care Assistance: Maximum assistance   Functional Limitations Info             SPECIAL CARE FACTORS FREQUENCY  PT (By licensed PT), OT (By licensed OT)     PT Frequency: 5x/wk OT Frequency: 5x/wk            Contractures      Additional Factors Info  Code Status, Allergies, Suctioning Needs Code Status Info: FULL Allergies Info: coconut oil       Suctioning Needs: 3-4x every 4 hours   Current Medications (12/14/2016):  This is the current hospital active medication list Current Facility-Administered Medications  Medication Dose Route Frequency Provider Last Rate Last Dose  . acetaminophen (TYLENOL) tablet 650 mg  650 mg Per Tube Q6H PRN FRogue Bussing MD   650 mg at 12/12/16 1519  . acetylcysteine (MUCOMYST) 20 % nebulizer / oral solution 4 mL  4 mL Nebulization BID FRaylene Miyamoto MD   4 mL at 12/14/16 0834  . albuterol (PROVENTIL) (2.5 MG/3ML) 0.083% nebulizer solution 2.5 mg  2.5 mg Nebulization BID AC & HS HCorey Harold NP   2.5 mg at 12/14/16 0834  . albuterol (PROVENTIL) (2.5 MG/3ML) 0.083% nebulizer solution 2.5 mg  2.5 mg Nebulization Q3H PRN MJuanito Doom MD  2.5 mg at 12/13/16 1231  . cefTRIAXone (ROCEPHIN) 1 g in dextrose 5 % 50 mL IVPB  1 g Intravenous Q24H Raylene Miyamoto, MD   Stopped at 12/13/16 1324  . chlorhexidine gluconate (MEDLINE KIT) (PERIDEX) 0.12 % solution 15 mL  15 mL Mouth Rinse BID Raylene Miyamoto, MD   15 mL at 12/14/16 0737  . docusate (COLACE) 50 MG/5ML liquid 100 mg  100 mg Per Tube Daily Raylene Miyamoto, MD   100 mg at 12/14/16 0931  . feeding supplement (OSMOLITE 1.2 CAL) liquid 1,000 mL  1,000 mL Per Tube Q24H Kara Mead V, MD 20 mL/hr at 12/13/16 1800 1,000 mL at 12/13/16 1800  . fluticasone (FLONASE) 50 MCG/ACT nasal spray 2 spray  2  spray Each Nare Daily Corey Harold, NP   2 spray at 12/10/16 1116  . guaiFENesin (ROBITUSSIN) 100 MG/5ML solution 100 mg  5 mL Per Tube Q4H PRN Rogue Bussing, MD      . ketorolac (TORADOL) 15 MG/ML injection 15 mg  15 mg Intravenous Q6H PRN Rogue Bussing, MD   15 mg at 12/11/16 0221  . MEDLINE mouth rinse  15 mL Mouth Rinse QID Raylene Miyamoto, MD   15 mL at 12/14/16 0400  . ondansetron (ZOFRAN) injection 4 mg  4 mg Intravenous Q8H PRN Acquanetta Chain, DO      . rivaroxaban (XARELTO) tablet 20 mg  20 mg Per Tube Q1200 Sloan Leiter B, RPH   20 mg at 12/13/16 1254  . sennosides (SENOKOT) 8.8 MG/5ML syrup 5 mL  5 mL Per Tube QHS Raylene Miyamoto, MD      . sodium chloride 0.9 % bolus 1,000 mL  1,000 mL Intravenous Once Lovenia Kim, MD         Discharge Medications: Please see discharge summary for a list of discharge medications.  Relevant Imaging Results:  Relevant Lab Results:   Additional Information SS#: [id number]  Geralynn Ochs, LCSW

## 2016-12-14 NOTE — Progress Notes (Signed)
Palliative Medicine RN Note: Pt reports he was able to tolerate vent much better yesterday and last night, and he is relaxed and smiling. Wife and sister at bedside.. He has orders to move to 4N. Plan to f/u tomorrow to see how he continues to do on vent.   Margret Chance Adamae Ricklefs, RN, BSN, [facility] 12/14/2016 12:23 PM Cell [phone] 8:00-4:00 Monday-Friday Office [phone]

## 2016-12-14 NOTE — Progress Notes (Signed)
eLink Physician-Brief Progress Note Patient Name: George Evans DOB: 11-Oct-1955 MRN: [id number]   Date of Service  12/14/2016  HPI/Events of Note  Anxiety - Patient trached and on nocturnal ventilation.  eICU Interventions  Will order: 1. Place patient on nocturnal ventilator settings.  2. Xanax 0.25 mg PO X 1 now.      Intervention Category Minor Interventions: Agitation / anxiety - evaluation and management  Chevy Sweigert Dennard Nip 12/14/2016, 9:55 PM

## 2016-12-14 NOTE — Progress Notes (Signed)
eLink Physician-Brief Progress Note Patient Name: George Evans DOB: 19-Sep-1955 MRN: [id number]   Date of Service  12/14/2016  HPI/Events of Note  Hypotension but making urine, feels well otherwise RN notes this started when he was put on vent  eICU Interventions  500cc saline bolus now     Intervention Category Intermediate Interventions: Hypotension - evaluation and management  Max Fickle 12/14/2016, 12:11 AM

## 2016-12-15 ENCOUNTER — Inpatient Hospital Stay (HOSPITAL_COMMUNITY): Payer: Medicare Other

## 2016-12-15 LAB — GLUCOSE, CAPILLARY
GLUCOSE-CAPILLARY: 115 mg/dL — AB (ref 65–99)
Glucose-Capillary: 98 mg/dL (ref 65–99)
Glucose-Capillary: 105 mg/dL — ABNORMAL HIGH (ref 65–99)
Glucose-Capillary: 111 mg/dL — ABNORMAL HIGH (ref 65–99)
Glucose-Capillary: 108 mg/dL — ABNORMAL HIGH (ref 65–99)

## 2016-12-15 LAB — BASIC METABOLIC PANEL
Anion gap: 8 (ref 5–15)
CALCIUM: 9.6 mg/dL (ref 8.9–10.3)
CO2: 27 mmol/L (ref 22–32)
Chloride: 104 mmol/L (ref 101–111)
Creatinine, Ser: <0.3 mg/dL — ABNORMAL LOW (ref 0.61–1.24)
GLUCOSE: 111 mg/dL — AB (ref 65–99)
Potassium: 3.2 mmol/L — ABNORMAL LOW (ref 3.5–5.1)
Sodium: 139 mmol/L (ref 135–145)

## 2016-12-15 LAB — CBC
HCT: 39 % (ref 39.0–52.0)
Hemoglobin: 12.8 g/dL — ABNORMAL LOW (ref 13.0–17.0)
MCH: 27.9 pg (ref 26.0–34.0)
MCHC: 32.8 g/dL (ref 30.0–36.0)
MCV: 85 fL (ref 78.0–100.0)
PLATELETS: 276 10*3/uL (ref 150–400)
RBC: 4.59 MIL/uL (ref 4.22–5.81)
RDW: 16.7 % — ABNORMAL HIGH (ref 11.5–15.5)
WBC: 6.3 10*3/uL (ref 4.0–10.5)

## 2016-12-15 MED ORDER — SODIUM CHLORIDE 0.9 % IV BOLUS (SEPSIS)
1000.0000 mL | Freq: Once | INTRAVENOUS | Status: AC
  Administered 2016-12-15: 1000 mL via INTRAVENOUS

## 2016-12-15 MED ORDER — POTASSIUM CHLORIDE 20 MEQ/15ML (10%) PO SOLN
40.0000 meq | Freq: Two times a day (BID) | ORAL | Status: AC
  Administered 2016-12-15 (×2): 40 meq
  Filled 2016-12-15 (×2): qty 30

## 2016-12-15 MED ORDER — POTASSIUM CHLORIDE CRYS ER 20 MEQ PO TBCR: 40.0000 meq | EXTENDED_RELEASE_TABLET | Freq: Two times a day (BID) | ORAL | Status: DC

## 2016-12-15 NOTE — Progress Notes (Signed)
Placed PT on new ATC set up. 

## 2016-12-15 NOTE — Progress Notes (Signed)
eLink Physician-Brief Progress Note Patient Name: George Evans DOB: 10-May-1956 MRN: [id number]   Date of Service  12/15/2016  HPI/Events of Note  Hypotension again in the middle of the night while on vent, normal mentation, normal urine output; last night work up was negative  eICU Interventions  Monitor, hold pressors, hold fluid resuscitation     Intervention Category Major Interventions: Hypotension - evaluation and management  Max Fickle 12/15/2016, 1:07 AM

## 2016-12-15 NOTE — Progress Notes (Addendum)
Family Medicine Teaching Service Daily Progress Note Intern Pager: [phone]  Patient name: George Evans Medical record number: [id number] Date of birth: 04-29-1956 Age: 61 y.o. Gender: male  Primary Care Provider: York Spaniel, MD Consultants: CCM, SLP Code Status: Full   Pt Overview and Major Events to Date:  George Evans is a 61 yo male presenting with SOB. Patient was admitted to [facility] on 6/25. Has been followed by CCM for care until transfer to our service on 7/3.   Assessment and Plan:  Suspected pneumonia Patient placed on ceftriaxone 1g, day 4. CXR on 7/3 shows improvement of LLL opacification from 7/2. -continue for total of 7 doses   Acute Respiratory failure 2/2 mucus plugging  Patient follows w/ Pulmonologist Dr. Isaiah Serge. Presented with IWOB and inability to handle secretions, intermittently requiring BiPAP in ED. Patient was not able to get Trilogy ventilator at SNF and nightly BiPAP has not been functioning appropriately. Patient received tracheostomy placement. SpO2 currently 99% on 6L/min O2. Respiratory rate 20. DG Chest Portable 1 view on 12/14/16 showed haziness on left lung base consistent with atelectasis, pneumonia, and possibly small left pleural effusion with no change in position of trach, improved from previous CXR.  -Continue trach care and flushing for relief from mucus   -Continue guaifenesin 100 mg/27mL solution 100 mg prn and mucomyst 4 mL -continuous pulse ox  -CXR on 7/4 is unchanged from 7/3 -palliative following  Limb-girdle muscular dystrophy.  Follows with Neurologist Dr. Anne Hahn. Progressive weakness resulting in swallowing and breathing difficulties at this time.  - Senokot daily    Tachycardia HR currently 98, up to 137 overnight. RRR. BP 102/73. EKG in ED showed sinus tachycardia. Likely due to respiratory distress. Possibly due to dehydration. TSH within normal limits at 0.491 -monitor on telemetry  - consider repeat 1000 mL normal saline fluid  bolus   Hx of PEs -Continue Xarelto anticoagulation   Hypokalemia K currently 3.2 -KCl 40 mEq x 2 per tube repleted 7/4 am -Continue to monitor   Protein-calorie Malnutrition  PEG tube placed on 6/28.  -Continue nutrition through peg tube  PPx: xarelto   Disposition: Patient is currently improving and is being prepared for discharge to SNF this week.  Subjective:  George Evans is a 61 yo male presenting for SOB. He has a PMH significant for limb-girdle muscular dystrophy, multiple PEs, and previously treated hepatitis C. Patient states that he is doing better. He did have an episode of shortness of breath when being moved for his bath one time last night.  He says that his back is still sore but is not worse than previous days.  He says that he is continuing to experience mucus production but that flushing has kept it under control.  Objective: Temp:  [97 F (36.1 C)-98.5 F (36.9 C)] 98.5 F (36.9 C) (07/04 0342) Pulse Rate:  [67-137] 98 (07/04 0323) Resp:  [10-26] 14 (07/04 0323) BP: (63-142)/(46-94) 63/53 (07/04 0323) SpO2:  [94 %-100 %] 100 % (07/04 0323) FiO2 (%):  [28 %-40 %] 40 % (07/04 0323) Weight:  [130 lb 11.7 oz (59.3 kg)-313 lb (142 kg)] 130 lb 11.7 oz (59.3 kg) (07/04 0345) Physical Exam: General: NAD, sleeping when I arrived but easily woken, thin chronically ill male sitting up in bed with trach in place Cardiovascular: regular rate, sinus rhythm, no MRG Respiratory: coarse breath sounds bilaterally  Abdomen: soft, non tender, non distended  Extremities: thin in all four extremities  Laboratory:  Recent Labs Lab 12/13/16  0818 12/14/16 0147 12/15/16 0220  WBC 8.0 6.3 6.3  HGB 13.8 13.7 12.8*  HCT 42.1 41.3 39.0  PLT 218 181 276    Recent Labs Lab 12/13/16 0349 12/14/16 0147 12/15/16 0220  NA 139 136 139  K 2.7* 3.6 3.2*  CL 106 104 104  CO2 28 25 27   BUN <5* <5* <5*  CREATININE <0.30* <0.30* <0.30*  CALCIUM 8.9 9.1 9.6  GLUCOSE 104* 101*  111*    Imaging/Diagnostic Tests:  DG Chest Port 1 View CLINICAL DATA:  Vent dependent EXAM: PORTABLE CHEST 1 VIEW COMPARISON:  12/14/2016 FINDINGS: Tracheostomy tube is unchanged. Left lower lobe atelectasis or infiltrate again noted, unchanged. Heart is normal size. Right lung is clear. IMPRESSION: Left lower lobe atelectasis or infiltrate, stable. Electronically Signed   By: Charlett Nose M.D.   On: 12/15/2016 07:22  DG Chest Port 1 View CLINICAL DATA:  History of pneumonia, evaluate tracheostomy position EXAM: PORTABLE CHEST 1 VIEW COMPARISON:  Portable chest x-ray of 12/13/2016 FINDINGS: There is persistent haziness at the left lung base suspicious for atelectasis/ pneumonia and possibly a small left pleural effusion. Minimal right basilar atelectasis remains. Mediastinal and hilar contours are unremarkable. Heart size is stable. Tracheostomy appears unchanged in position. No bony abnormality is seen. IMPRESSION: 1. Persistent haziness at the left lung base most consistent with atelectasis, pneumonia, and possibly a small left pleural effusion. 2. No apparent change in position of tracheostomy. Electronically Signed   By: Dwyane Dee M.D.   On: 12/14/2016 09:16  DG Chest Port 1 View CLINICAL DATA:  Respiratory failure. EXAM: PORTABLE CHEST 1 VIEW COMPARISON:  12/11/2016. FINDINGS: Tracheostomy tube in stable position. Heart size normal. Progressive left lower lobe infiltrate. Mild bibasilar atelectasis. Small left pleural effusion. No pneumothorax. IMPRESSION: 1. Tracheostomy tube in stable position. 2. Progressive left lower lobe infiltrate. Mild bibasilar atelectasis Electronically Signed   By: Maisie Fus  Register   On: 12/13/2016 06:32   Lennox Solders, MD 12/15/2016, 6:17 AM PGY-1, [facility] Family Medicine FPTS Intern pager: [phone], text pages welcome

## 2016-12-15 NOTE — Progress Notes (Signed)
Palliative Medicine RN Note:  Spoke with RN, as well as pt and family. RN Bethann Berkshire reports that plan is to go to rehab, possibly tomorrow. Patient is relaxed and smiling. He communicates via Financial planner. He is aware of plan and has no complaints or needs during my visit. Plan for f/u tomorrow before noon.  Margret Chance Angelee Bahr, RN, BSN, [facility] 12/15/2016 2:42 PM Cell [phone] 8:00-4:00 Monday-Friday Office [phone]

## 2016-12-15 NOTE — Progress Notes (Signed)
Per MD order, removed PT from vent and placed on ATC.

## 2016-12-15 NOTE — Progress Notes (Signed)
Initially RN received foley d/c orders. Patient and family requested to leave the foley cath for another day or two, Md at bed side. Explained to the pt and family importance to remove foley asap. Order d/c ed for now.   George Evans

## 2016-12-16 ENCOUNTER — Inpatient Hospital Stay (HOSPITAL_COMMUNITY): Payer: Medicare Other

## 2016-12-16 DIAGNOSIS — J9811 Atelectasis: Secondary | ICD-10-CM

## 2016-12-16 LAB — BASIC METABOLIC PANEL
ANION GAP: 8 (ref 5–15)
BUN: 5 mg/dL — ABNORMAL LOW (ref 6–20)
CALCIUM: 9.9 mg/dL (ref 8.9–10.3)
CO2: 29 mmol/L (ref 22–32)
Chloride: 104 mmol/L (ref 101–111)
GLUCOSE: 125 mg/dL — AB (ref 65–99)
Potassium: 3.7 mmol/L (ref 3.5–5.1)
Sodium: 141 mmol/L (ref 135–145)

## 2016-12-16 LAB — GLUCOSE, CAPILLARY
GLUCOSE-CAPILLARY: 115 mg/dL — AB (ref 65–99)
GLUCOSE-CAPILLARY: 108 mg/dL — AB (ref 65–99)
GLUCOSE-CAPILLARY: 106 mg/dL — AB (ref 65–99)
Glucose-Capillary: 107 mg/dL — ABNORMAL HIGH (ref 65–99)
Glucose-Capillary: 107 mg/dL — ABNORMAL HIGH (ref 65–99)
Glucose-Capillary: 111 mg/dL — ABNORMAL HIGH (ref 65–99)
Glucose-Capillary: 96 mg/dL (ref 65–99)

## 2016-12-16 LAB — CBC
HEMATOCRIT: 41.2 % (ref 39.0–52.0)
Hemoglobin: 12.8 g/dL — ABNORMAL LOW (ref 13.0–17.0)
MCH: 27 pg (ref 26.0–34.0)
MCHC: 31.1 g/dL (ref 30.0–36.0)
MCV: 86.9 fL (ref 78.0–100.0)
Platelets: 293 10*3/uL (ref 150–400)
RBC: 4.74 MIL/uL (ref 4.22–5.81)
RDW: 17 % — ABNORMAL HIGH (ref 11.5–15.5)
WBC: 5.3 10*3/uL (ref 4.0–10.5)

## 2016-12-16 MED ORDER — SODIUM CHLORIDE 3 % IN NEBU
4.0000 mL | INHALATION_SOLUTION | Freq: Two times a day (BID) | RESPIRATORY_TRACT | Status: DC
  Administered 2016-12-16 – 2016-12-18 (×5): 4 mL via RESPIRATORY_TRACT
  Filled 2016-12-16 (×8): qty 4

## 2016-12-16 MED ORDER — GUAIFENESIN 100 MG/5ML PO SOLN
5.0000 mL | ORAL | Status: DC
Start: 2016-12-16 — End: 2016-12-19
  Administered 2016-12-16 – 2016-12-18 (×12): 100 mg
  Filled 2016-12-16 (×16): qty 5

## 2016-12-16 MED ORDER — LORAZEPAM 2 MG/ML IJ SOLN
1.0000 mg | Freq: Four times a day (QID) | INTRAMUSCULAR | Status: DC | PRN
  Administered 2016-12-16 – 2016-12-19 (×5): 1 mg via INTRAVENOUS
  Filled 2016-12-16 (×5): qty 1

## 2016-12-16 NOTE — Progress Notes (Signed)
[facility] PCCM Attending:  I have seen and examined the patient with nurse practitioner/resident and agree with the note above.  We formulated the plan together and I elicited the following history.    S: Was on trach collar most of the day yesterday  O: Vitals:   12/16/16 0900 12/16/16 1000 12/16/16 1100 12/16/16 1125  BP: 135/74 110/73 107/70   Pulse: (!) 127 (!) 115 (!) 110 (!) 110  Resp: 19 (!) 24 (!) 23 (!) 23  Temp:      TempSrc:      SpO2: (!) 89% 94% 96% 99%  Weight:      Height:       Vent Mode: PRVC FiO2 (%):  [28 %-40 %] 28 % Set Rate:  [10 bmp] 10 bmp Vt Set:  [540 mL] 540 mL PEEP:  [5 cmH20] 5 cmH20 Plateau Pressure:  [15 cmH20-19 cmH20] 15 cmH20  General:  Resting comfortably in bed HENT: NCAT Trach in place PULM: CTA B, normal effort CV: RRR, no mgr GI: BS+, soft, nontender MSK: normal bulk and tone Neuro: awake, alert, no distress, MAEW  CBC    Component Value Date/Time   WBC 5.3 12/16/2016 0354   RBC 4.74 12/16/2016 0354   HGB 12.8 (L) 12/16/2016 0354   HCT 41.2 12/16/2016 0354   PLT 293 12/16/2016 0354   MCV 86.9 12/16/2016 0354   MCH 27.0 12/16/2016 0354   MCHC 31.1 12/16/2016 0354   RDW 17.0 (H) 12/16/2016 0354   LYMPHSABS 0.9 12/04/2016 1715   MONOABS 0.8 11/30/2016 1715   EOSABS 0.0 11/22/2016 1715   BASOSABS 0.0 11/27/2016 1715   BMET    Component Value Date/Time   NA 141 12/16/2016 0947   K 3.7 12/16/2016 0947   CL 104 12/16/2016 0947   CO2 29 12/16/2016 0947   GLUCOSE 125 (H) 12/16/2016 0947   BUN 5 (L) 12/16/2016 0947   CREATININE <0.30 (L) 12/16/2016 0947   CALCIUM 9.9 12/16/2016 0947   GFRNONAA NOT CALCULATED 12/16/2016 0947   GFRAA NOT CALCULATED 12/16/2016 0947   7/4 CXR > LLL atelectasis images independently reviewed  Impression/Plan:  Acute respiratory failure with hypoxemia: continue vent support at night, Trach collar during the daytime, I have asked SLP to consider PMV during daytime.  He will need a home vent, will ask  case management.    Left lung atelectasis: add chest PT, hypertonic saline nebs bid, d/c mucomyst   Heber Crestview, MD [place] PCCM Pager: [phone] Cell: [phone] After 3pm or if no response, call [phone]

## 2016-12-16 NOTE — Progress Notes (Signed)
Family Medicine Teaching Service Daily Progress Note Intern Pager: [phone]  Patient name: George Evans Medical record number: [id number] Date of birth: Nov 16, 1955 Age: 61 y.o. Gender: male  Primary Care Provider: York Spaniel, MD Consultants: CCM, SLP Code Status: Full   Pt Overview and Major Events to Date:  George Evans is a 61 yo male presenting with SOB. Patient was admitted to [facility] on 6/25. Has been followed by CCM for care until transfer to our service on 7/3.   Assessment and Plan:  Suspected pneumonia Patient placed on ceftriaxone 1g, day 5. CXR's on 7/4 and 7/3 shows improvement of LLL opacification from 7/2. -continue for total of 7 doses   Acute Respiratory failure 2/2 mucus plugging  Patient follows w/ Pulmonologist Dr. Isaiah Serge. Presented with IWOB and inability to handle secretions, intermittently requiring BiPAP in ED. Patient was not able to get Trilogy ventilator at SNF and nightly BiPAP has not been functioning appropriately. Patient received tracheostomy placement. SpO2 currently 99% on 6L/min O2. Respiratory rate 20. DG Chest Portable 1 view on 12/14/16 showed haziness on left lung base consistent with atelectasis, pneumonia, and possibly small left pleural effusion with no change in position of trach, improved from previous CXR. DG Chest Portable 1 view on 12/15/16 showed no significant change from 7/3.   -Continue use of ACT to decrease work of breathing -Continue trach care and flushing for relief from mucus   -Continue guaifenesin 100 mg/21mL solution 100 mg prn and mucomyst 4 mL -continuous pulse ox  -CXR on 7/4 is unchanged from 7/3 -palliative following -changed mucinex to scheduled Q4H from PRN to help loosen secretions  Limb-girdle muscular dystrophy.  Follows with Neurologist Dr. Anne Hahn. Progressive weakness resulting in swallowing and breathing difficulties at this time.  - Senokot daily   - patient requested to leave his foley catheter until 7/5 because  using a urinal is difficult due to his weakened finger dexterity.  Change to condom cath? Before discharging to SNF   Tachycardia HR currently 84, up to 110's 7/4 pm. RRR. BP 100/73. Previous tachycardia likely due to dehydration; resolved with NS bolus on 7/4. TSH within normal limits at 0.491 -monitor on telemetry  - consider repeat 1000 mL normal saline fluid bolus if becomes tachycardic again  Hx of PEs -Continue Xarelto anticoagulation   Hypokalemia K currently 3.7, although he has had repeated hypokalemia previous days - K repletion through his peg tube makes him nauseated; consider using K-supplemented nutrition -Continue to monitor   Protein-calorie Malnutrition  PEG tube placed on 6/28.  -Continue nutrition through peg tube  PPx: xarelto   Disposition: Patient is currently improving and is being prepared for discharge to [facility] Thursday or Friday.  Subjective:  George Evans is a 61 yo male presenting for SOB. He has a PMH significant for limb-girdle muscular dystrophy, multiple PEs, and previously treated hepatitis C. Patient states that he is doing better. He did have an episode of tachycardia into the 130s when being suctioned one time last night.  He says that his back is still sore but is not worse than previous days.  He says that he is continuing to experience thick secretions and that suctioning them is helpful but makes him feel very anxious.  Objective: Temp:  [97.7 F (36.5 C)-98.6 F (37 C)] 98.4 F (36.9 C) (07/05 0419) Pulse Rate:  [73-109] 84 (07/05 0419) Resp:  [10-26] 10 (07/05 0419) BP: (72-136)/(53-96) 100/73 (07/05 0300) SpO2:  [97 %-100 %] 100 % (07/05 0419)  FiO2 (%):  [28 %-40 %] 40 % (07/05 0419) Physical Exam: General: NAD, sleeping when I arrived but easily woken, thin chronically ill male sitting up in bed with trach in place Cardiovascular: regular rate, sinus rhythm, no MRG Respiratory: coarse breath sounds bilaterally  Abdomen: soft,  non tender, non distended  Extremities: thin in all four extremities  Laboratory:  Recent Labs Lab 12/14/16 0147 12/15/16 0220 12/16/16 0354  WBC 6.3 6.3 5.3  HGB 13.7 12.8* 12.8*  HCT 41.3 39.0 41.2  PLT 181 276 293    Recent Labs Lab 12/13/16 0349 12/14/16 0147 12/15/16 0220  NA 139 136 139  K 2.7* 3.6 3.2*  CL 106 104 104  CO2 28 25 27   BUN <5* <5* <5*  CREATININE <0.30* <0.30* <0.30*  CALCIUM 8.9 9.1 9.6  GLUCOSE 104* 101* 111*    Imaging/Diagnostic Tests:  DG Chest Port 1 View CLINICAL DATA:  Vent dependent EXAM: PORTABLE CHEST 1 VIEW COMPARISON:  12/14/2016 FINDINGS: Tracheostomy tube is unchanged. Left lower lobe atelectasis or infiltrate again noted, unchanged. Heart is normal size. Right lung is clear. IMPRESSION: Left lower lobe atelectasis or infiltrate, stable. Electronically Signed   By: Charlett Nose M.D.   On: 12/15/2016 07:22  DG Chest Port 1 View CLINICAL DATA:  History of pneumonia, evaluate tracheostomy position EXAM: PORTABLE CHEST 1 VIEW COMPARISON:  Portable chest x-ray of 12/13/2016 FINDINGS: There is persistent haziness at the left lung base suspicious for atelectasis/ pneumonia and possibly a small left pleural effusion. Minimal right basilar atelectasis remains. Mediastinal and hilar contours are unremarkable. Heart size is stable. Tracheostomy appears unchanged in position. No bony abnormality is seen. IMPRESSION: 1. Persistent haziness at the left lung base most consistent with atelectasis, pneumonia, and possibly a small left pleural effusion. 2. No apparent change in position of tracheostomy. Electronically Signed   By: Dwyane Dee M.D.   On: 12/14/2016 09:16  DG Chest Port 1 View CLINICAL DATA:  Respiratory failure. EXAM: PORTABLE CHEST 1 VIEW COMPARISON:  12/11/2016. FINDINGS: Tracheostomy tube in stable position. Heart size normal. Progressive left lower lobe infiltrate. Mild bibasilar atelectasis. Small  left pleural effusion. No pneumothorax. IMPRESSION: 1. Tracheostomy tube in stable position. 2. Progressive left lower lobe infiltrate. Mild bibasilar atelectasis Electronically Signed   By: Maisie Fus  Register   On: 12/13/2016 06:32   Lennox Solders, MD 12/16/2016, 5:35 AM PGY-1, [facility] Family Medicine FPTS Intern pager: [phone], text pages welcome

## 2016-12-16 NOTE — Progress Notes (Signed)
Family Medicine Teaching Service Daily Progress Note Intern Pager: [phone]  Patient name: George Evans Medical record number: [id number] Date of birth: 29-May-1956 Age: 61 y.o. Gender: male  Primary Care Provider: York Spaniel, MD Consultants: CCM, SLP Code Status: Full   Pt Overview and Major Events to Date:  George Evans is a 61 yo male presenting with SOB. Patient was admitted to [facility] on 6/25. Has been followed by CCM for care until transfer to our service on 7/3.   Assessment and Plan:  Suspected pneumonia Patient placed on ceftriaxone 1g, day 6. CXR's on 7/4 and 7/3 shows improvement of LLL opacification from 7/2.  CXR on 7/5 shows stable LLL opacity/atalectasis from 7/4.  Bronchiolar lavage showed WBCs and normal respiratory flora. -continue for total of 7 doses   Acute Respiratory failure 2/2 mucus plugging  Patient follows w/ Pulmonologist Dr. Isaiah Serge. Presented with IWOB and inability to handle secretions, intermittently requiring BiPAP in ED. Patient was not able to get Trilogy ventilator at SNF and nightly BiPAP has not been functioning appropriately. Patient received tracheostomy placement. SpO2 currently 99% on 6L/min O2. Respiratory rate 20. DG Chest Portable 1 view on 12/14/16 showed haziness on left lung base consistent with atelectasis, pneumonia, and possibly small left pleural effusion with no change in position of trach, improved from previous CXR. DG Chest Portable 1 view on 12/15/16 showed no significant change from 7/3.   -Continue use of ACT to decrease work of breathing -Continue trach care and flushing for relief from mucus   -Continue guaifenesin 100 mg/58mL solution 100 mg QID -continuous pulse ox  -CXR on 7/5 is unchanged from 7/4 -palliative following -added chest PT, hypertonic saline nebs bid, d/c'd mucomyst according to Pulmonology recs  Limb-girdle muscular dystrophy.  Follows with Neurologist Dr. Anne Hahn. Progressive weakness resulting in swallowing and  breathing difficulties at this time.  - Senokot daily   - patient requested to leave his foley catheter until 7/6 because using a urinal is difficult due to his weakened finger dexterity.  Change to condom cath? Before discharging to SNF   Tachycardia HR currently 98, up to 127 7/6. RRR. BP 105/75. Previous tachycardia likely due to dehydration and pain; resolved with NS bolus on 7/4, but has had episodes of tachycardia when throat is being suctioned. TSH within normal limits at 0.491 -monitor on telemetry  - consider repeat 1000 mL normal saline fluid bolus if becomes tachycardic again  Hx of PEs -Continue Xarelto anticoagulation   Hypokalemia K currently 3.6, although he has had repeated hypokalemia previous days - K repletion through his peg tube makes him nauseated; however nutrition says no nutrition formula with higher potassium is available -Continue to monitor   Protein-calorie Malnutrition  PEG tube placed on 6/28.  -Continue nutrition through peg tube  Constipation Last bowel movement was on 7/2, needs to have a bowel movement before going to SNF -Dulcolax suppository  PPx: xarelto   Disposition: Patient is currently improving and is being prepared for discharge to [facility] on 7/6.  Subjective:  George Evans is a 61 yo male presenting for SOB. He has a PMH significant for limb-girdle muscular dystrophy, multiple PEs, and previously treated hepatitis C. Patient states that he is doing better. He did have an episode of tachycardia into the 130s when being suctioned one time last night.  He says that his back is still sore but is not worse than previous days.  He says that he is continuing to experience thick secretions and  that suctioning them is helpful but makes him feel very anxious.  Objective: Temp:  [97.7 F (36.5 C)-99.6 F (37.6 C)] 98.8 F (37.1 C) (07/05 1957) Pulse Rate:  [76-127] 99 (07/05 1957) Resp:  [10-27] 14 (07/05 1957) BP: (72-135)/(53-85) 92/73  (07/05 1957) SpO2:  [89 %-100 %] 100 % (07/05 2101) FiO2 (%):  [28 %-40 %] 40 % (07/05 2101) Weight:  [132 lb 4.4 oz (60 kg)] 132 lb 4.4 oz (60 kg) (07/05 0500) Physical Exam: General: NAD, sleeping when I arrived and through most of exam, thin chronically ill male sitting up in bed with trach in place Cardiovascular: regular rate, sinus rhythm, no MRG Respiratory: coarse breath sounds bilaterally  Abdomen: soft, non tender, non distended  Extremities: thin in all four extremities  Laboratory:  Recent Labs Lab 12/15/16 0220 12/16/16 0354 12/17/16 0303  WBC 6.3 5.3 6.3  HGB 12.8* 12.8* 12.3*  HCT 39.0 41.2 37.5*  PLT 276 293 269    Recent Labs Lab 12/14/16 0147 12/15/16 0220 12/16/16 0947  NA 136 139 141  K 3.6 3.2* 3.7  CL 104 104 104  CO2 25 27 29   BUN <5* <5* 5*  CREATININE <0.30* <0.30* <0.30*  CALCIUM 9.1 9.6 9.9  GLUCOSE 101* 111* 125*    Imaging/Diagnostic Tests:  DG Chest Port 1 View CLINICAL DATA:  Vent dependent EXAM: PORTABLE CHEST 1 VIEW COMPARISON:  12/14/2016 FINDINGS: Tracheostomy tube is unchanged. Left lower lobe atelectasis or infiltrate again noted, unchanged. Heart is normal size. Right lung is clear. IMPRESSION: Left lower lobe atelectasis or infiltrate, stable. Electronically Signed   By: Charlett Nose M.D.   On: 12/15/2016 07:22  DG Chest Port 1 View CLINICAL DATA:  History of pneumonia, evaluate tracheostomy position EXAM: PORTABLE CHEST 1 VIEW COMPARISON:  Portable chest x-ray of 12/13/2016 FINDINGS: There is persistent haziness at the left lung base suspicious for atelectasis/ pneumonia and possibly a small left pleural effusion. Minimal right basilar atelectasis remains. Mediastinal and hilar contours are unremarkable. Heart size is stable. Tracheostomy appears unchanged in position. No bony abnormality is seen. IMPRESSION: 1. Persistent haziness at the left lung base most consistent with atelectasis, pneumonia, and  possibly a small left pleural effusion. 2. No apparent change in position of tracheostomy. Electronically Signed   By: Dwyane Dee M.D.   On: 12/14/2016 09:16  DG Chest Port 1 View CLINICAL DATA:  Respiratory failure. EXAM: PORTABLE CHEST 1 VIEW COMPARISON:  12/11/2016. FINDINGS: Tracheostomy tube in stable position. Heart size normal. Progressive left lower lobe infiltrate. Mild bibasilar atelectasis. Small left pleural effusion. No pneumothorax. IMPRESSION: 1. Tracheostomy tube in stable position. 2. Progressive left lower lobe infiltrate. Mild bibasilar atelectasis Electronically Signed   By: Maisie Fus  Register   On: 12/13/2016 06:32   Lennox Solders, MD 12/16/2016, 9:29 PM PGY-1, [facility] Family Medicine FPTS Intern pager: [phone], text pages welcome

## 2016-12-16 NOTE — Progress Notes (Signed)
Palliative Medicine RN Note: Pt is very relaxed and sleepy an unable to stay awake for my visit. Family reports he had an anxiety attack earlier and that the Ativan made him sleepy. SW Marisue Ivan reports that pt may have a bed at [facility] this afternoon, but family and nurse report that Dr Kendrick Fries told them all that pt will not be leaving today; poss try [facility] tomorrow. PMT will f/u 7/6.   Margret Chance Iley Breeden, RN, BSN, [facility] 12/16/2016 3:45 PM Cell [phone] 8:00-4:00 Monday-Friday Office [phone]

## 2016-12-16 NOTE — Progress Notes (Signed)
ANTICOAGULATION CONSULT NOTE  Pharmacy Consult for Xarelto Indication: History of PE  Allergies  Allergen Reactions  . Coconut Oil Swelling    Patient Measurements: Height: 6' (182.9 cm) Weight: 132 lb 4.4 oz (60 kg) IBW/kg (Calculated) : 77.6  Vital Signs: Temp: 98.8 F (37.1 C) (07/05 0808) Temp Source: Oral (07/05 0808) BP: 107/70 (07/05 1100) Pulse Rate: 110 (07/05 1125)  Labs:  Recent Labs  12/14/16 0147 12/15/16 0220 12/16/16 0354 12/16/16 0947  HGB 13.7 12.8* 12.8*  --   HCT 41.3 39.0 41.2  --   PLT 181 276 293  --   CREATININE <0.30* <0.30*  --  <0.30*    CrCl cannot be calculated (This lab value cannot be used to calculate CrCl because it is not a number: <0.30).   Medical History: Past Medical History:  Diagnosis Date  . Abnormal posture   . Chronic viral hepatitis C (HCC)   . Constipation   . Difficulty walking   . Distal muscular dystrophy (HCC)   . 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 Major depressive disorder   . Muscle wasting   . Muscle weakness   . Other diseases of bronchus, not elsewhere classified (CODE)   . Peripheral vascular disease (HCC)   . Pressure ulcer of sacral region     Assessment: 61 year old male with muscular dystrophy and history of PE on Xarelto chronically prior to admission. SCr is difficult to assess due to very low muscle mass in setting of muscular dystrophy (<0.30). Patient was initially started on heparin for a couple days and then was restarted on Xarelto on 7/2. CBC stable.   Goal of Therapy:  Monitor platelets by anticoagulation protocol: Yes    Plan:  Continue Xarelto 20mg  per tube daily Monitor renal function and for signs/symptoms of bleeding.     Agapito Games, PharmD, BCPS Clinical Pharmacist 12/16/2016 11:52 AM

## 2016-12-16 NOTE — Progress Notes (Signed)
Nutrition Consult / Follow-up  DOCUMENTATION CODES:   Not applicable  INTERVENTION:   Continue:  Osmolite 1.2 via PEG at 60 ml/h (1440 ml/day)   Prostat 30 ml once daily   Provides 1828 kcals, 95 gm protein, 1180 ml free water, and 2606 mg potassium daily  NUTRITION DIAGNOSIS:   Inadequate oral intake related to dysphagia as evidenced by NPO status.  Ongoing  GOAL:   Patient will meet greater than or equal to 90% of their needs  Met with TF  MONITOR:   Labs, Weight trends, TF tolerance, Skin, I & O's  REASON FOR ASSESSMENT:   Consult Enteral/tube feeding initiation and management  ASSESSMENT:   George Evans is a 61 y.o. male presenting with SOB . PMH is significant for limb-girdle muscular dystrophy, multiple PEs, and previously treated hepatitis C.   Discussed patient in ICU rounds and with RN today. Received consult for a higher potassium TF formula. Reviewed TF options on [facility] formulary; no higher potassium formula available / appropriate for patient.  Patient is on trach collar during the day and requires vent support at night.  Patient is currently receiving Osmolite 1.2 via PEG at 60 ml/h (1440 ml/day) with Prostat 30 ml once daily to provide 1828 kcals, 95 gm protein, 1180 ml free water daily.   Labs reviewed: potassium 3.2 (L) Medications reviewed and include Colace.  Diet Order:  Diet NPO time specified  Skin:  Reviewed, no issues  Last BM:  PTA  Height:   Ht Readings from Last 1 Encounters:  12/10/16 6' (1.829 m)    Weight:   Wt Readings from Last 1 Encounters:  12/16/16 132 lb 4.4 oz (60 kg)    Ideal Body Weight:  80.9 kg  BMI:  Body mass index is 17.94 kg/m.  Estimated Nutritional Needs:   Kcal:  2574-9355  Protein:  90-105 grams  Fluid:  1.7-1.9 L  EDUCATION NEEDS:   Education needs addressed  Molli Barrows, [place], [place], [place] Pager [phone] After Hours Pager [phone]

## 2016-12-16 NOTE — Progress Notes (Signed)
SLP Cancellation Note  Patient Details Name: George Evans MRN: [id number] DOB: 1956/05/19   Cancelled treatment:       Reason Eval/Treat Not Completed: Medical issues which prohibited therapy. Pt back on vent. Will f/u next date for PMV eval.   Metro Kung, MA, CCC-SLP 12/16/2016, 1:18 PM [phone]

## 2016-12-16 NOTE — Progress Notes (Signed)
CSW following to facilitate discharge. CSW informed by patient's wife that the patient did not have a good day today, and will not be transitioned to SNF today.  CSW will follow for update tomorrow to determine when patient will be medically ready. CSW will facilitate discharge to [facility] when medically appropriate.  Blenda Nicely, [place] Clinical Social Worker [phone]

## 2016-12-17 LAB — GLUCOSE, CAPILLARY
GLUCOSE-CAPILLARY: 125 mg/dL — AB (ref 65–99)
Glucose-Capillary: 97 mg/dL (ref 65–99)
Glucose-Capillary: 93 mg/dL (ref 65–99)
Glucose-Capillary: 119 mg/dL — ABNORMAL HIGH (ref 65–99)
Glucose-Capillary: 111 mg/dL — ABNORMAL HIGH (ref 65–99)

## 2016-12-17 LAB — BASIC METABOLIC PANEL
ANION GAP: 7 (ref 5–15)
BUN: 9 mg/dL (ref 6–20)
CALCIUM: 9.5 mg/dL (ref 8.9–10.3)
CHLORIDE: 105 mmol/L (ref 101–111)
CO2: 23 mmol/L (ref 22–32)
Creatinine, Ser: <0.3 mg/dL — ABNORMAL LOW (ref 0.61–1.24)
GLUCOSE: 98 mg/dL (ref 65–99)
POTASSIUM: 3.6 mmol/L (ref 3.5–5.1)
Sodium: 135 mmol/L (ref 135–145)

## 2016-12-17 LAB — CBC
HCT: 37.5 % — ABNORMAL LOW (ref 39.0–52.0)
Hemoglobin: 12.3 g/dL — ABNORMAL LOW (ref 13.0–17.0)
MCH: 27.8 pg (ref 26.0–34.0)
MCHC: 32.8 g/dL (ref 30.0–36.0)
MCV: 84.8 fL (ref 78.0–100.0)
PLATELETS: 269 10*3/uL (ref 150–400)
RBC: 4.42 MIL/uL (ref 4.22–5.81)
RDW: 16.6 % — ABNORMAL HIGH (ref 11.5–15.5)
WBC: 6.3 10*3/uL (ref 4.0–10.5)

## 2016-12-17 MED ORDER — OSMOLITE 1.2 CAL PO LIQD: 1000.0000 mL | ORAL | 0 refills | Status: AC

## 2016-12-17 MED ORDER — CEFTRIAXONE SODIUM 1 G IJ SOLR: 1.0000 g | Freq: Once | INTRAMUSCULAR | 0 refills | Status: AC

## 2016-12-17 MED ORDER — BISACODYL 10 MG RE SUPP
10.0000 mg | Freq: Every day | RECTAL | Status: DC | PRN
  Administered 2016-12-17: 10 mg via RECTAL
  Filled 2016-12-17: qty 1

## 2016-12-17 MED ORDER — LORAZEPAM 2 MG/ML IJ SOLN
0.5000 mg | Freq: Once | INTRAMUSCULAR | Status: AC
  Administered 2016-12-17: 0.5 mg via INTRAVENOUS
  Filled 2016-12-17: qty 1

## 2016-12-17 NOTE — Progress Notes (Signed)
Pt back on vent support.

## 2016-12-17 NOTE — Progress Notes (Signed)
[facility] critical care medicine.  I was asked to come back to the bedside by the primary service to talk to the patient's family based on their request. The patient was to go to one of our local long-term acute care hospital today but the transfer was put on hold due to a bed situation at that facility. In the last 24 hours the patient has stated that his shortness of breath has worsened. Based on my review of the chest x-ray from last night there is no new infiltrate. He states that he is having a harder time producing mucus.  On exam: Vitals:   12/17/16 1250 12/17/16 1602  BP:  (!) 84/63  Pulse: (!) 118 (!) 106  Resp: (!) 26 10  Temp:  98.4 F (36.9 C)    General:  tachypneic in bed HENT: NCAT OP clear PULM: Diminished breath sounds on the left, clear on the right, tachypnea  CV: RRR, no mgr GI: BS+, soft, nontender MSK: normal bulk and tone Neuro: awake, alert, anxious, MAEW  Chest x-ray from last night images independently reviewed showing no clear infiltrate besides left lower lobe atelectasis which is persistent. Tracheostomy in place.  Acute on chronic respiratory failure with hypercapnia: I see no evidence that the patient has developed a new pulmonary process or cardiac process to explain the worsening symptoms in the last 24 hours. Chest imaging shows no evidence of pneumonia. I explained to he and his family today that I believe the reason why his symptoms have worsened is because his underlying neurologic illness is progressing. This condition will continue to worsen over the next several weeks and I think that there is a good chance with in the next 7 days he may require full-time ventilatory support.  Today took the time to ask them if they truly considered the ramifications of full-time ventilatory support with a progressive neurologic illness that man cannot cure. They told me that no one had really talk to them frankly about this. I explained to them that he is  going to require full dilatory support and will get to a point where he cannot communicate and not let the rest of [place] know that he is in pain. Who will likely die of a complication of his healthcare such as pneumonia or some other such process.  Very quickly after describing this Mr. Clack indicated to his wife that he was ready to die.  Because of the abrupt change in his decision-making I advised him to go on a ventilator to try to catch his breath, may be taken nap and rest of that and then revisit this with his wife. She thinks that it's reasonable however for him to consider full comfort measures. But she agrees with me if he should probably rest tonight and then consider tomorrow whether or not he truly wants full comfort measures.  Greater than 40 minutes spent in direct consultation with the patient and his wife today  I have asked the respiratory therapist to put him back on ventilatory support  Heber Campbell, MD [place] [facility] Pager: [phone] Cell: [phone] After 3pm or if no response, call [phone]

## 2016-12-17 NOTE — Progress Notes (Signed)
Patient transferred from [facility]- plan was to DC to [facility]- [facility] anticipated having a bed available but the only space they have is in a room with a patient on contact precautions- since patient does not currently have the same infections he would be at risk of contracting due to open wound and facility unable to accept into that room  CSW will continue to follow- referrals were already sent out to other facilities- CSW following up with those facility options- no beds available for today  Burna Sis, LCSW Clinical Social Worker [phone]

## 2016-12-17 NOTE — Progress Notes (Signed)
Pt placed on 28% ATC and tolerating well at this time.  Will continue to monitor.

## 2016-12-17 NOTE — Care Management Important Message (Signed)
Important Message  Patient Details  Name: Capers Zierden MRN: [id number] Date of Birth: Oct 18, 1955   Medicare Important Message Given:  Yes    Dorena Bodo 12/17/2016, 12:05 PM

## 2016-12-17 NOTE — Discharge Instructions (Signed)
Please stop taking Robinul.  Please schedule an appointment with your regular doctor within one week.

## 2016-12-17 NOTE — Progress Notes (Signed)
Pt refused chest pt.  Pt says that he cannot tolerate it due to placement of his feeding tube.

## 2016-12-17 NOTE — Evaluation (Signed)
[facility] Speaking Valve - Evaluation Patient Details  Name: George Evans MRN: [id number] Date of Birth: [birth year]  Today's Date: 12/17/2016 Time: 1000-1015 SLP Time Calculation (min) (ACUTE ONLY): 15 min, another 10 minutes - 25 total  Past Medical History:  Past Medical History:  Diagnosis Date  . Abnormal posture   . Chronic viral hepatitis C (HCC)   . Constipation   . Difficulty walking   . Distal muscular dystrophy (HCC)   . 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 Major depressive disorder   . Muscle wasting   . Muscle weakness   . Other diseases of bronchus, not elsewhere classified (CODE)   . Peripheral vascular disease (HCC)   . Pressure ulcer of sacral region    Past Surgical History:  Past Surgical History:  Procedure Laterality Date  . APPENDECTOMY  1992  . IR GASTROSTOMY TUBE MOD SED  12/09/2016  . OTHER SURGICAL HISTORY  2016   Abcess removed from penis  . ROTATOR CUFF REPAIR Right 1989   HPI:  Pt is a 61 y.o. male with PMH of limb-girdle muscular dystrophy, multiple PEs, and previously treated hepatitis C, presenting to ED on 6/25 with SOB and mucus plugging/ inability to handle secretions, intermittently requiring BiPAP in ED. Pt reportedly not eating much due to difficulty swallowing. Pt previously seen on recent admission after concern for pill aspiration (11/28/16). A MBS was completed March 2018 at outside facility- report unavailable but per SLP note 11/28/16 pt reported being told to have small bites/ sips and that there was a high aspiration risk although aspiration not observed during study. During previous evaluation recommendation was for a dysphagia 3 diet, thin liquids, meds crushed in puree along with compensatory strategies- multiple swallows, follow solids with liquid, effortful swallow. Bedside swallow eval ordered to re-evaluate.    Assessment / Plan / Recommendation Clinical Impression  Pt demonstrates adequate redirection of air to upper  airway with PMSV, no signs of air trapping. Pt able to phonate at word and short phrase level though increased effort needed for adequate breath support and volume. Pt obviously suffers from decreased strength of respiratory musculature and family reports very soft voice prior to admit as well. Provided teaching for family to place and remove valve as they provide full supervision. Sister and wife demonstrated placement and removal and recalled primary precautions. After another session, SLP returned to place sign and pt noticed to have some signs of respiratory fatigue. RT removed PMSV with decreased RR, though it came right back up. Discussed periodic placement with family depending on pts endurance with trach collar. Pt encouraged to attempt to orally expectorate secretions with PMSV in place. Will follow for futher teaching. Would benefit from RMT.  SLP Visit Diagnosis: Aphonia (R49.1)    SLP Assessment  Patient needs continued Speech Lanaguage Pathology Services    Follow Up Recommendations  24 hour supervision/assistance    Frequency and Duration min 2x/week  2 weeks    PMSV Trial PMSV was placed for:  (10 minutes, 40 minutes later) Able to redirect subglottic air through upper airway: Yes Able to Attain Phonation: Yes Voice Quality: Breathy;Low vocal intensity Able to Expectorate Secretions: Yes Level of Secretion Expectoration with PMSV: Oral Breath Support for Phonation: Severely decreased Intelligibility: Intelligibility reduced Word: 75-100% accurate Phrase: 50-74% accurate Sentence: 25-49% accurate Conversation: 25-49% accurate Respirations During Trial: 27 (later, 35) SpO2 During Trial: 100 % Behavior: Alert;Cooperative   Tracheostomy Tube  Additional Tracheostomy Tube Assessment Trach Collar Period: 3-4 hours each  day Secretion Description: minimal, thin clear Level of Secretion Expectoration: Tracheal    Vent Dependency  Vent Dependent: Yes Vent Mode:  Stand-by Set Rate: 10 bmp PEEP: 5 cmH20 FiO2 (%): 40 % Vt Set: 540 mL Nocturnal Vent: Yes    Cuff Deflation Trial  GO Murry Diaz, MA CCC-SLP [phone]  Tolerated Cuff Deflation: Yes Length of Time for Cuff Deflation Trial:  (baseline)        Yousef Huge, Riley Nearing 12/17/2016, 12:25 PM

## 2016-12-18 DIAGNOSIS — J9621 Acute and chronic respiratory failure with hypoxia: Secondary | ICD-10-CM

## 2016-12-18 DIAGNOSIS — J9622 Acute and chronic respiratory failure with hypercapnia: Secondary | ICD-10-CM

## 2016-12-18 LAB — BASIC METABOLIC PANEL
Anion gap: 6 (ref 5–15)
BUN: 9 mg/dL (ref 6–20)
CALCIUM: 9.6 mg/dL (ref 8.9–10.3)
CO2: 26 mmol/L (ref 22–32)
Chloride: 108 mmol/L (ref 101–111)
GLUCOSE: 105 mg/dL — AB (ref 65–99)
Potassium: 3.2 mmol/L — ABNORMAL LOW (ref 3.5–5.1)
Sodium: 140 mmol/L (ref 135–145)

## 2016-12-18 LAB — CBC
HEMATOCRIT: 39 % (ref 39.0–52.0)
Hemoglobin: 12.4 g/dL — ABNORMAL LOW (ref 13.0–17.0)
MCH: 27.7 pg (ref 26.0–34.0)
MCHC: 31.8 g/dL (ref 30.0–36.0)
MCV: 87.2 fL (ref 78.0–100.0)
Platelets: 291 10*3/uL (ref 150–400)
RBC: 4.47 MIL/uL (ref 4.22–5.81)
RDW: 17 % — AB (ref 11.5–15.5)
WBC: 6.8 10*3/uL (ref 4.0–10.5)

## 2016-12-18 LAB — GLUCOSE, CAPILLARY
Glucose-Capillary: 101 mg/dL — ABNORMAL HIGH (ref 65–99)
Glucose-Capillary: 102 mg/dL — ABNORMAL HIGH (ref 65–99)
Glucose-Capillary: 110 mg/dL — ABNORMAL HIGH (ref 65–99)
Glucose-Capillary: 118 mg/dL — ABNORMAL HIGH (ref 65–99)
Glucose-Capillary: 120 mg/dL — ABNORMAL HIGH (ref 65–99)

## 2016-12-18 MED ORDER — ONDANSETRON HCL 4 MG/2ML IJ SOLN
4.0000 mg | Freq: Once | INTRAMUSCULAR | Status: AC
  Administered 2016-12-18: 4 mg via INTRAVENOUS
  Filled 2016-12-18: qty 2

## 2016-12-18 MED ORDER — POTASSIUM CHLORIDE 20 MEQ/15ML (10%) PO SOLN
40.0000 meq | Freq: Two times a day (BID) | ORAL | Status: DC
  Administered 2016-12-18: 40 meq
  Filled 2016-12-18 (×2): qty 30

## 2016-12-18 NOTE — Progress Notes (Signed)
Reviewed chart. Patient back on vent after only a 20 minute TC trial per RN. Noted possible plans for GOC meeting. If patient unable to tolerate TC for standard PMV placement, could consider in-line PMV to assist in patient's communication during discussion. Please page SLP for assistance if needed this weekend at [phone]. Otherwise, will f/u 7/9.   Ferdinand Lango MA, CCC-SLP [phone]

## 2016-12-18 NOTE — Progress Notes (Addendum)
Daily Progress Note   Patient Name: George Evans       Date: 12/18/2016 DOB: 18-Aug-1955  Age: 61 y.o. MRN#: [id number] Attending Physician: Lind Covert, MD Primary Care Physician: Kathrynn Ducking, MD Admit Date: 11/12/2016  Reason for Consultation/Follow-up: Establishing goals of care, Psychosocial/spiritual support, Terminal Care and Withdrawal of life-sustaining treatment  Subjective: Asked to see patient to review goals of care again by family medicine teaching service. Up until 24 hours ago, patient was for transfer to [facility], full aggressive care. On 12/17/2016 family requested to speak to Dr. Lake Bells again (please see Dr. Anastasia Pall note from 12/17/2016), to review again clinical status and goals Patient's 3 sisters, mother as well as spouse present. Patient himself had just received Ativan and was somnolent. Spoke to family members at length regarding goals of care specifically CODE STATUS as well as one way extubation.  Length of Stay: 12  Current Medications: Scheduled Meds:  . albuterol  2.5 mg Nebulization BID AC & HS  . chlorhexidine gluconate (MEDLINE KIT)  15 mL Mouth Rinse BID  . docusate  100 mg Per Tube Daily  . feeding supplement (OSMOLITE 1.2 CAL)  1,000 mL Per Tube Q24H  . fluticasone  2 spray Each Nare Daily  . guaiFENesin  5 mL Per Tube Q4H  . mouth rinse  15 mL Mouth Rinse QID  . potassium chloride  40 mEq Per Tube BID  . rivaroxaban  20 mg Per Tube Q1200  . sennosides  5 mL Per Tube QHS  . sodium chloride HYPERTONIC  4 mL Nebulization BID    Continuous Infusions:   PRN Meds: acetaminophen, albuterol, bisacodyl, LORazepam, ondansetron (ZOFRAN) IV  Physical Exam  Constitutional:  Older, frail, cachectic acutely ill appearing man in ICU  HENT:    Temporal wasting  Cardiovascular: Normal rate and regular rhythm.   Pulmonary/Chest:  Patient has a trach; currently on full ventilator support  Neurological:  Somnolent  Skin: Skin is warm and dry.  Psychiatric:  Unable to test  Nursing note and vitals reviewed.           Vital Signs: BP 102/66 (BP Location: Left Arm)   Pulse (!) 102   Temp 98.3 F (36.8 C) (Oral)   Resp 17   Ht 6' (1.829 m)   Wt 63.8 kg (140  lb 10.5 oz)   SpO2 100%   BMI 19.08 kg/m  SpO2: SpO2: 100 % O2 Device: O2 Device: Ventilator O2 Flow Rate: O2 Flow Rate (L/min): 6 L/min  Intake/output summary:  Intake/Output Summary (Last 24 hours) at 12/18/16 1803 Last data filed at 12/18/16 1400  Gross per 24 hour  Intake              460 ml  Output             1525 ml  Net            -1065 ml   LBM: Last BM Date: 12/13/16 Baseline Weight: Weight: 70.3 kg (155 lb) Most recent weight: Weight: 63.8 kg (140 lb 10.5 oz)       Palliative Assessment/Data:    Flowsheet Rows     Most Recent Value  Intake Tab  Referral Department  Hospitalist  Unit at Time of Referral  Intermediate Care Unit  Palliative Care Primary Diagnosis  Pulmonary  Date Notified  11/17/2016  Palliative Care Type  New Palliative care  Reason for referral  Clarify Goals of Care  Date of Admission  11/28/2016  Date first seen by Palliative Care  12/07/16  # of days Palliative referral response time  1 Day(s)  # of days IP prior to Palliative referral  0  Clinical Assessment  Palliative Performance Scale Score  30%  Psychosocial & Spiritual Assessment  Palliative Care Outcomes  Patient/Family meeting held?  Yes  Who was at the meeting?  patient, wife, sister  Palliative Care Outcomes  Clarified goals of care, Counseled regarding hospice, ACP counseling assistance, Provided psychosocial or spiritual support      Patient Active Problem List   Diagnosis Date Noted  . History of ETT   . Acute respiratory distress   . At high risk for  aspiration   . Hypokalemia   . Moderate protein-calorie malnutrition ([place])   . Dysphagia   . Palliative care by specialist   . Goals of care, counseling/discussion   . SOB (shortness of breath)   . Acute neuromuscular respiratory failure ([place]) 11/25/2016  . 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 Aspiration into airway 11/27/2016  . Choking   . Muscular weakness   . Respiratory failure, chronic neuromuscular ([place]) 09/14/2016  . 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 Limb-girdle muscular dystrophy ([place]) 08/19/2016    Palliative Care Assessment & Plan   Patient Profile: 61 y.o. male  with past medical history of limb-girdle muscular dystrophy, PVD, PE on xarelto, depression, viral hepatitis C, and dysphagia admitted on 11/20/2016 with shortness of breath. Recent hospitalization from 6/16-6/19 for acute respiratory failure due to poor secretion clearance. Discharged to SNF with orders for HS BiPAP but there was complications with BiPAP at the facility. In ED, patient placed on BiPAP. Patient with acute on chronic respiratory failure in the setting of progressive muscular dystrophy. PRN and HS BiPAP. Receiving chest physical therapy, nebs, flonase, robitussin, and robinul. Palliative Medicine consulted for [place]. Since initial consultation with palliative medicine on 12/07/2016 patient has undergone an elective trach as well as PEG tube placement. He has been unable to wean from the ventilator. Plan was for patient to go to [facility] but on 12/17/2016 family requested to speak to critical care medicine regarding changes to goals of care, comfort care.  Assessment: Met with patient's wife and his sisters for goals of care discussion. Mrs. Mcinerny, shares with  me that her husband has been ill since 2004 when he was initially diagnosed with hepatitis C after he went to a routine blood bank drive. She states the treatment for  hepatitis C "woke up a dormant form of muscular dystrophy". She states patient has had a progressive decline, but especially worse since 2013. Patient has not walked since 2013 and has been living in skilled facilities since that time. He has been in 5 different skilled nursing facilities. She states months before he was admitted to the hospital this time, and his sisters verify this, he has been verbalizing that he is tired, tired of fighting and accepting of his terminal illness. When patient spoke to Dr. Lake Bells on 12/17/2016 he stated directly "I want to die". Wife shares that she believes he opted for trach and PEG in order to stay alive long enough to see sister form [place] but also his fear of "smothering to death", and "starving to death". We discussed that as the body nears EOL the drive to eat and drink, except for perhaps bites and sips, is not present.  Discussed specifically usage of morphine and Versed continuous infusions to withdraw ventilator. We will begin those infusions prior to withdrawal and when patient was sleepy will switch to trach collar with O2 support. Prepared wife and sisters that patient could die within a matter of hours or it may take several days. Also attempted to prepare them that he will not be awake at this point. They all were in agreement to proceed with this but would like to see if he can participate in this discussion.  Recommendations/Plan:  Plan to meet with patient tomorrow at noon to see if he can participate in goals of care discussion specifically withdrawal of ventilator, changed to trach collar with the usage of morphine and Versed continuous infusions. Family is concerned that he may be confused at this point and unable to participate but would like to see if he is capable of that on 12/19/2016  Wife is opted for a DO NOT RESUSCITATE at this point. Orders were placed. Family present for this decision and my verification of this decision  Goals of Care  and Additional Recommendations:  Limitations on Scope of Treatment: Full Scope Treatment except for DNR at this point. Reviewing withdrawal of ventilator on 12/19/16 at 1200  Code Status:    Code Status Orders        Start     Ordered   12/18/16 1741  Do not attempt resuscitation (DNR)  Continuous    Question Answer Comment  In the event of cardiac or respiratory ARREST Do not call a "code blue"   In the event of cardiac or respiratory ARREST Do not perform Intubation, CPR, defibrillation or ACLS   In the event of cardiac or respiratory ARREST Use medication by any route, position, wound care, and other measures to relive pain and suffering. May use oxygen, suction and manual treatment of airway obstruction as needed for comfort.      12/18/16 1740    Code Status History    Date Active Date Inactive Code Status Order ID Comments User Context   12/05/2016  7:07 PM 12/18/2016  5:40 PM Full Code [id number]  Bufford Lope, DO Inpatient   11/27/2016  4:51 PM 11/30/2016  7:32 PM Full Code [id number]  Rogue Bussing, MD ED       Prognosis:   Patient's prognosis will be dependent upon if and when ventilator is withdrawn. Patient is  terminally ill regardless of whether he remains on the ventilator and likely only has a prognosis of months. If decision is reached to withdraw the ventilator prognosis is hours to days  Discharge Planning:  Discharge planning again will depend upon decisions made on 12/19/2016 regarding continued ventilator support. If ventilator is withdrawn in the hospital anticipate hospital death  Care plan was discussed with Dr. Lake Bells  Thank you for allowing the Palliative Medicine Team to assist in the care of this patient.   Time In: 1600 Time Out: 1730 Total Time 90 min Prolonged Time Billed  yes       Greater than 50%  of this time was spent counseling and coordinating care related to the above assessment and plan.  Dory Horn, NP  Please  contact Palliative Medicine Team phone at [phone] for questions and concerns.

## 2016-12-18 NOTE — Progress Notes (Signed)
Family Medicine Teaching Service Daily Progress Note Intern Pager: [phone]  Patient name: George Evans Medical record number: [id number] Date of birth: May 14, 1956 Age: 61 y.o. Gender: male  Primary Care Provider: York Spaniel, MD Consultants: CCM, SLP Code Status: Full   Pt Overview and Major Events to Date:  George Evans is a 61 yo male presenting with SOB. Patient was admitted to [facility] on 6/25. Has been followed by CCM for care until transfer to our service on 7/3.    Assessment and Plan:  Suspected pneumonia Patient placed on ceftriaxone 1g, day 6. CXR's on 7/4 and 7/3 shows improvement of LLL opacification from 7/2.  CXR on 7/5 shows stable LLL opacity/atalectasis from 7/4.  Bronchiolar lavage showed WBCs and normal respiratory flora. -has completed 7 doses of CXR   Acute Respiratory failure 2/2 mucus plugging  Patient follows w/ Pulmonologist George Evans. Presented with IWOB and inability to handle secretions, intermittently requiring BiPAP in ED. Patient was not able to get Trilogy ventilator at SNF and nightly BiPAP has not been functioning appropriately. Patient received tracheostomy placement. SpO2 currently 99% on 6L/min O2. Respiratory rate 20. DG Chest Portable 1 view on 12/14/16 showed haziness on left lung base consistent with atelectasis, pneumonia, and possibly small left pleural effusion with no change in position of trach, improved from previous CXR. DG Chest Portable 1 view on 12/15/16 showed no significant change from 7/3.   -Continue use of ACT to decrease work of breathing -Continue trach care and flushing for relief from mucus   -Continue guaifenesin 100 mg/87mL solution 100 mg QID -continuous pulse ox  -CXR on 7/5 is unchanged from 7/4 -palliative following -added chest PT, hypertonic saline nebs bid, d/c'd mucomyst according to Pulmonology recs -Bed at SNF unavailable 7/6.  CCM George Evans saw patient per family request and pt reconsidering his code status; very  quickly following their discussion regarding his condition pt expressed that he would rather die.  Given the quick change of mind, George Evans recommending giving it some time to consider his options and reassess this morning.  -May require palliative and family to conduct a GOC meeting today   Limb-girdle muscular dystrophy.  Follows with Neurologist George Evans. Progressive weakness resulting in swallowing and breathing difficulties at this time.  - Senokot daily   - patient requested to leave his foley catheter until 7/6 because using a urinal is difficult due to his weakened finger dexterity.  - Change to condom cath? Before discharging to SNF.   Tachycardia.  Improved with 1L NS Bolus 7/4, but has have intermittent episodes of tachycardia when being suctioned. TSH within normal limits at 0.491.  -monitor on telemetry  - consider repeat 1000 mL normal saline fluid bolus if becomes tachycardic again  Hx of PEs -Continue Xarelto anticoagulation   Hypokalemia. K + this AM  3.2.  - K repletion through his peg tube makes him nauseated; however nutrition says no nutrition formula with higher potassium is available -Will give Kdur through PEG and give Zofran for nausea   Protein-calorie Malnutrition . PEG tube placed on 6/28.  -Continue nutrition through peg tube  Constipation -Dulcolax suppository   PPx: Xarelto    Disposition: Possible discharge to [facility] however pt now considering full comfort care    Subjective:  Mr Evans is alone in his room this morning and appears down.  He denies pain and expresses that he is doing fine.  When asked if family will be coming to visit today he states  yes.  He is considering full comfort care and has thought about it overnight but is unsure what he wants at this time. Will need to have a family meeting with palliative to address GOC.     Objective: Temp:  [97.5 F (36.4 C)-98.4 F (36.9 C)] 98.2 F (36.8 C) (07/07 0740) Pulse Rate:   [84-126] 94 (07/07 0740) Resp:  [10-27] 19 (07/07 0740) BP: (84-115)/(55-79) 94/59 (07/07 0740) SpO2:  [97 %-100 %] 100 % (07/07 0740) FiO2 (%):  [40 %] 40 % (07/07 0457) Weight:  [140 lb 10.5 oz (63.8 kg)] 140 lb 10.5 oz (63.8 kg) (07/07 0346)   Physical Exam: General: awake, sitting upright in bed, flat affect  Cardiovascular: regular rate, sinus rhythm, no MRG Respiratory: coarse breath sounds bilaterally  Abdomen: soft, non tender, +bs Extremities: thin in all four extremities Psych: appears depressed, flat affect   Laboratory:  Recent Labs Lab 12/16/16 0354 12/17/16 0303 12/18/16 0317  WBC 5.3 6.3 6.8  HGB 12.8* 12.3* 12.4*  HCT 41.2 37.5* 39.0  PLT 293 269 291    Recent Labs Lab 12/16/16 0947 12/17/16 0737 12/18/16 0317  NA 141 135 140  K 3.7 3.6 3.2*  CL 104 105 108  CO2 29 23 26   BUN 5* 9 9  CREATININE <0.30* <0.30* <0.30*  CALCIUM 9.9 9.5 9.6  GLUCOSE 125* 98 105*   Imaging/Diagnostic Tests:  DG Chest Port 1 View CLINICAL DATA:  Vent dependent EXAM: PORTABLE CHEST 1 VIEW COMPARISON:  12/14/2016 FINDINGS: Tracheostomy tube is unchanged. Left lower lobe atelectasis or infiltrate again noted, unchanged. Heart is normal size. Right lung is clear. IMPRESSION: Left lower lobe atelectasis or infiltrate, stable. Electronically Signed   By: Charlett Nose M.D.   On: 12/15/2016 07:22  DG Chest Port 1 View CLINICAL DATA:  History of pneumonia, evaluate tracheostomy position EXAM: PORTABLE CHEST 1 VIEW COMPARISON:  Portable chest x-ray of 12/13/2016 FINDINGS: There is persistent haziness at the left lung base suspicious for atelectasis/ pneumonia and possibly a small left pleural effusion. Minimal right basilar atelectasis remains. Mediastinal and hilar contours are unremarkable. Heart size is stable. Tracheostomy appears unchanged in position. No bony abnormality is seen. IMPRESSION: 1. Persistent haziness at the left lung base most consistent with  atelectasis, pneumonia, and possibly a small left pleural effusion. 2. No apparent change in position of tracheostomy. Electronically Signed    By: Dwyane Dee M.D.    On: 12/14/2016 09:16   DG Chest Port 1 View CLINICAL DATA:  Respiratory failure. EXAM: PORTABLE CHEST 1 VIEW COMPARISON:  12/11/2016. FINDINGS: Tracheostomy tube in stable position. Heart size normal. Progressive left lower lobe infiltrate. Mild bibasilar atelectasis. Small left pleural effusion. No pneumothorax. IMPRESSION: 1. Tracheostomy tube in stable position. 2. Progressive left lower lobe infiltrate. Mild bibasilar atelectasis Electronically Signed   By: Maisie Fus  Register   On: 12/13/2016 06:32   Freddrick March, MD 12/18/2016, 8:37 AM PGY-2, [facility] FPTS Intern pager: [phone], text pages welcome

## 2016-12-19 LAB — CBC
HCT: 40.1 % (ref 39.0–52.0)
HEMOGLOBIN: 12.7 g/dL — AB (ref 13.0–17.0)
MCH: 27.5 pg (ref 26.0–34.0)
MCHC: 31.7 g/dL (ref 30.0–36.0)
MCV: 86.8 fL (ref 78.0–100.0)
Platelets: 319 10*3/uL (ref 150–400)
RBC: 4.62 MIL/uL (ref 4.22–5.81)
RDW: 16.9 % — ABNORMAL HIGH (ref 11.5–15.5)
WBC: 7.4 10*3/uL (ref 4.0–10.5)

## 2016-12-19 MED ORDER — MORPHINE SULFATE (PF) 4 MG/ML IV SOLN
1.0000 mg | INTRAVENOUS | Status: DC | PRN
  Administered 2016-12-19: 1 mg via INTRAVENOUS
  Filled 2016-12-19: qty 1

## 2016-12-19 MED ORDER — MORPHINE SULFATE 25 MG/ML IV SOLN
6.0000 mg/h | INTRAVENOUS | Status: DC
  Administered 2016-12-19: 1 mg/h via INTRAVENOUS
  Administered 2016-12-21: 5 mg/h via INTRAVENOUS
  Filled 2016-12-19 (×2): qty 10

## 2016-12-19 MED ORDER — MORPHINE BOLUS VIA INFUSION
2.0000 mg | INTRAVENOUS | Status: DC | PRN
  Administered 2016-12-19 – 2016-12-20 (×3): 4 mg via INTRAVENOUS
  Administered 2016-12-21 (×4): 2 mg via INTRAVENOUS
  Filled 2016-12-19: qty 4

## 2016-12-19 MED ORDER — SODIUM CHLORIDE 0.9 % IV SOLN
6.0000 mg/h | INTRAVENOUS | Status: DC
  Administered 2016-12-19: 1 mg/h via INTRAVENOUS
  Administered 2016-12-20: 3 mg/h via INTRAVENOUS
  Administered 2016-12-21: 4 mg/h via INTRAVENOUS
  Administered 2016-12-21: 5 mg/h via INTRAVENOUS
  Administered 2016-12-22: 6 mg/h via INTRAVENOUS
  Filled 2016-12-19 (×6): qty 10

## 2016-12-19 MED ORDER — LORAZEPAM 2 MG/ML IJ SOLN
1.0000 mg | INTRAMUSCULAR | Status: DC | PRN
  Administered 2016-12-20: 1 mg via INTRAVENOUS
  Filled 2016-12-19: qty 1

## 2016-12-19 MED ORDER — MIDAZOLAM BOLUS VIA INFUSION (WITHDRAWAL LIFE SUSTAINING TX)
2.0000 mg | INTRAVENOUS | Status: DC | PRN
  Administered 2016-12-19 – 2016-12-20 (×3): 4 mg via INTRAVENOUS
  Administered 2016-12-21 (×2): 2 mg via INTRAVENOUS
  Administered 2016-12-21: 4 mg via INTRAVENOUS
  Filled 2016-12-19: qty 4

## 2016-12-19 NOTE — Progress Notes (Signed)
Family Medicine Teaching Service Daily Progress Note Intern Pager: [phone]  Patient name: George Evans Medical record number: [id number] Date of birth: 1956/05/02 Age: 61 y.o. Gender: male  Primary Care Provider: York Spaniel, MD Consultants: CCM, SLP Code Status: Full   Pt Overview and Major Events to Date:  George Evans is a 62 yo male presenting with SOB. Patient was admitted to [facility] on 6/25. Has been followed by CCM for care until transfer to our service on 7/3.    Assessment and Plan:  Suspected pneumonia Patient placed on ceftriaxone 1g, day 6. CXR's on 7/4 and 7/3 shows improvement of LLL opacification from 7/2.  CXR on 7/5 shows stable LLL opacity/atalectasis from 7/4.  Bronchiolar lavage showed WBCs and normal respiratory flora. -has completed 7 doses of CXR   Acute Respiratory failure 2/2 mucus plugging  Patient follows w/ Pulmonologist Dr. Isaiah Serge. Presented with IWOB and inability to handle secretions, intermittently requiring BiPAP in ED. Patient was not able to get Trilogy ventilator at SNF and nightly BiPAP has not been functioning appropriately. Patient received tracheostomy placement. SpO2 currently 99% on 6L/min O2. Respiratory rate 20. DG Chest Portable 1 view on 12/14/16 showed haziness on left lung base consistent with atelectasis, pneumonia, and possibly small left pleural effusion with no change in position of trach, improved from previous CXR. DG Chest Portable 1 view on 12/15/16 showed no significant change from 7/3.   -Continue use of ACT to decrease work of breathing -Continue trach care and flushing for relief from mucus   -Continue guaifenesin 100 mg/11mL solution 100 mg QID -continuous pulse ox  -CXR on 7/5 is unchanged from 7/4 -palliative following - we will follow their recommendations regarding withdrawal of life sustaining measures - hypertonic saline nebs bid according to Pulmonology recs to loosen secretions - we will discontinue labs when patient  decides to become full comfort care  Limb-girdle muscular dystrophy.  Follows with Neurologist Dr. Anne Hahn. Progressive weakness resulting in swallowing and breathing difficulties at this time.  - Senokot daily   - patient requested to leave his foley catheter because using a urinal is difficult due to his weakened finger dexterity. Will continue this due to priority of patient comfort  Tachycardia.  Improved with 1L NS Bolus 7/4, but has have intermittent episodes of tachycardia when being suctioned. TSH within normal limits at 0.491.  -monitor on telemetry  - consider repeat 1000 mL normal saline fluid bolus if becomes tachycardic again  Hx of PEs -Continue Xarelto anticoagulation   Hypokalemia - K repletion through his peg tube makes him nauseated; however nutrition says no nutrition formula with higher potassium is available -Will give Kdur through PEG and give Zofran for nausea  - Will d/c potassium supplementation when moved to comfort care  Protein-calorie Malnutrition . PEG tube placed on 6/28.  -Continue nutrition through peg tube   PPx: Xarelto    Disposition: Patient has decided to have full comfort care and discontinue the ventilator once he has said his goodbyes to family and friends, so he will remain in hospital.  Subjective:  George Evans is surrounded by family and was in good spirits while talking with them.  He says that he is uncomfortable and wishes to no longer be on the ventilator since he has very little chance of ever coming off of it.  During the family meeting with palliative care, he was tearful but sure of his decision.  He will decide when to start the comfort care process after he  meets with several more friends and his pastors.      Objective: Temp:  [98.3 F (36.8 C)-98.6 F (37 C)] 98.6 F (37 C) (07/07 2336) Pulse Rate:  [88-108] 92 (07/08 0727) Resp:  [11-28] 14 (07/08 0727) BP: (88-121)/(64-92) 121/91 (07/08 0727) SpO2:  [94 %-100 %] 100 %  (07/08 0727) FiO2 (%):  [28 %-40 %] 30 % (07/08 0727) Weight:  [136 lb 0.4 oz (61.7 kg)] 136 lb 0.4 oz (61.7 kg) (07/08 0412)   Physical Exam: General: awake, sitting upright in bed, interactive with family members and caregivers Cardiovascular: regular rate, sinus rhythm, no MRG Respiratory: coarse breath sounds bilaterally  Abdomen: soft, non tender, +bs Extremities: thin in all four extremities Psych: appears sad but talkative with family  Laboratory:  Recent Labs Lab 12/17/16 0303 12/18/16 0317 12/19/16 0253  WBC 6.3 6.8 7.4  HGB 12.3* 12.4* 12.7*  HCT 37.5* 39.0 40.1  PLT 269 291 319    Recent Labs Lab 12/16/16 0947 12/17/16 0737 12/18/16 0317  NA 141 135 140  K 3.7 3.6 3.2*  CL 104 105 108  CO2 29 23 26   BUN 5* 9 9  CREATININE <0.30* <0.30* <0.30*  CALCIUM 9.9 9.5 9.6  GLUCOSE 125* 98 105*   Imaging/Diagnostic Tests:  DG Chest Port 1 View CLINICAL DATA:  Vent dependent EXAM: PORTABLE CHEST 1 VIEW COMPARISON:  12/14/2016 FINDINGS: Tracheostomy tube is unchanged. Left lower lobe atelectasis or infiltrate again noted, unchanged. Heart is normal size. Right lung is clear. IMPRESSION: Left lower lobe atelectasis or infiltrate, stable. Electronically Signed   By: Charlett Nose M.D.   On: 12/15/2016 07:22  DG Chest Port 1 View CLINICAL DATA:  History of pneumonia, evaluate tracheostomy position EXAM: PORTABLE CHEST 1 VIEW COMPARISON:  Portable chest x-ray of 12/13/2016 FINDINGS: There is persistent haziness at the left lung base suspicious for atelectasis/ pneumonia and possibly a small left pleural effusion. Minimal right basilar atelectasis remains. Mediastinal and hilar contours are unremarkable. Heart size is stable. Tracheostomy appears unchanged in position. No bony abnormality is seen. IMPRESSION: 1. Persistent haziness at the left lung base most consistent with atelectasis, pneumonia, and possibly a small left pleural effusion. 2. No apparent  change in position of tracheostomy. Electronically Signed    By: Dwyane Dee M.D.    On: 12/14/2016 09:16   DG Chest Port 1 View CLINICAL DATA:  Respiratory failure. EXAM: PORTABLE CHEST 1 VIEW COMPARISON:  12/11/2016. FINDINGS: Tracheostomy tube in stable position. Heart size normal. Progressive left lower lobe infiltrate. Mild bibasilar atelectasis. Small left pleural effusion. No pneumothorax. IMPRESSION: 1. Tracheostomy tube in stable position. 2. Progressive left lower lobe infiltrate. Mild bibasilar atelectasis Electronically Signed   By: Maisie Fus  Register   On: 12/13/2016 06:32   Winfrey, Harlen Labs, MD 12/19/2016, 8:14 AM PGY-1, [facility] FPTS Intern pager: [phone], text pages welcome

## 2016-12-19 NOTE — Progress Notes (Signed)
Over course of PM shift, patient becoming more and more solemn and refusing care. Patient does not want to have CBG checked, vitals checked, Medications administered ( besides ativan ), or be turned. Patient states " this is my last night on earth." Patient's wife at bedside and is agreeable to husband's wishes. Patient has plans to discuss terminal weaning with palliative today.   Noe Gens, RN

## 2016-12-19 NOTE — Progress Notes (Signed)
Visited patient at bedside with Dr. Shan Levans. Patient has met with his pastors and has family gathered. He and his wife confirm they would like to proceed with comfort care measures. Orders placed. Respiratory therapist and patient's nurse aware and ready to initiate comfort care. To start morphine and versed drip prior to removal from ventilator.   Olene Floss, MD [facility], PGY-3

## 2016-12-19 NOTE — Progress Notes (Signed)
Daily Progress Note   Patient Name: George Evans       Date: 12/19/2016 DOB: [birth year]  Age: 61 y.o. MRN#: [id number] Attending Physician: Lind Covert, MD Primary Care Physician: Kathrynn Ducking, MD Admit Date: 12/07/2016  Reason for Consultation/Follow-up: Establishing goals of care, Non pain symptom management, Pain control, Psychosocial/spiritual support, Terminal Care and Withdrawal of life-sustaining treatment  Subjective: Met with patient, patient's wife George Evans, and patient's sister George Evans, and Dr. Azalee Course for further goals of care discussion, specifically withdrawal of ventilator support in anticipation of end-of-life.  Patient is alert and communicating by pointing to a letter board as well as mouthing words. He shared with [place] that he was "ready to go meet Jesus". I explained to him the specifics of withdrawing the ventilator to include medications that would induce sedation before the ventilator is taken off in order to ensure there would be no respiratory distress and he was in agreement (a length conversation regarding the specific medications were discussed with spouse on 12/18/2016). Patient shares with his family that he wishes to have his pastor come see him and other friends to say his goodbyes before starting withdrawal of ventilator support. Prognosis of hours to days given  Length of Stay: 13  Current Medications: Scheduled Meds:  . albuterol  2.5 mg Nebulization BID AC & HS  . chlorhexidine gluconate (MEDLINE KIT)  15 mL Mouth Rinse BID  . docusate  100 mg Per Tube Daily  . feeding supplement (OSMOLITE 1.2 CAL)  1,000 mL Per Tube Q24H  . fluticasone  2 spray Each Nare Daily  . guaiFENesin  5 mL Per Tube Q4H  . mouth rinse  15 mL Mouth Rinse QID  . rivaroxaban  20  mg Per Tube Q1200  . sennosides  5 mL Per Tube QHS    Continuous Infusions:   PRN Meds: acetaminophen, albuterol, bisacodyl, LORazepam, ondansetron (ZOFRAN) IV  Physical Exam  Constitutional: He is oriented to person, place, and time.  Cachectic, frail acutely ill appearing older man. Alert and oriented  HENT:  Temporal wasting  Cardiovascular: Normal rate and regular rhythm.   Pulmonary/Chest:  On ventilator support; trach  Abdominal:  PEG tube  Genitourinary:  Genitourinary Comments: Foley   Neurological: He is alert and oriented to person, place, and time.  Skin: Skin is warm  and dry.  Psychiatric:  Tearful, anxious  Nursing note and vitals reviewed.           Vital Signs: BP 110/76   Pulse (!) 101   Temp 98.6 F (37 C) (Oral)   Resp 15   Ht 6' (1.829 m)   Wt 61.7 kg (136 lb 0.4 oz)   SpO2 100%   BMI 18.45 kg/m  SpO2: SpO2: 100 % O2 Device: O2 Device: Ventilator O2 Flow Rate: O2 Flow Rate (L/min): 6 L/min  Intake/output summary:  Intake/Output Summary (Last 24 hours) at 12/19/16 1403 Last data filed at 12/19/16 0600  Gross per 24 hour  Intake              320 ml  Output              300 ml  Net               20 ml   LBM: Last BM Date: 12/13/16 Baseline Weight: Weight: 70.3 kg (155 lb) Most recent weight: Weight: 61.7 kg (136 lb 0.4 oz)       Palliative Assessment/Data:    Flowsheet Rows     Most Recent Value  Intake Tab  Referral Department  Hospitalist  Unit at Time of Referral  Intermediate Care Unit  Palliative Care Primary Diagnosis  Pulmonary  Date Notified  11/28/2016  Palliative Care Type  New Palliative care  Reason for referral  Clarify Goals of Care  Date of Admission  11/22/2016  Date first seen by Palliative Care  12/07/16  # of days Palliative referral response time  1 Day(s)  # of days IP prior to Palliative referral  0  Clinical Assessment  Palliative Performance Scale Score  30%  Psychosocial & Spiritual Assessment  Palliative  Care Outcomes  Patient/Family meeting held?  Yes  Who was at the meeting?  patient, wife, sister  Palliative Care Outcomes  Clarified goals of care, Counseled regarding hospice, ACP counseling assistance, Provided psychosocial or spiritual support      Patient Active Problem List   Diagnosis Date Noted  . History of ETT   . Acute respiratory distress   . At high risk for aspiration   . Hypokalemia   . Moderate protein-calorie malnutrition ([place])   . Dysphagia   . Palliative care by specialist   . Goals of care, counseling/discussion   . SOB (shortness of breath)   . Acute neuromuscular respiratory failure ([place]) 11/15/2016  . 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 Aspiration into airway 11/27/2016  . Choking   . Muscular weakness   . Respiratory failure ([place]) 09/14/2016  . 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 Limb-girdle muscular dystrophy ([place]) 08/19/2016    Palliative Care Assessment & Plan   Patient Profile: 61 y.o.malewith past medical history of limb-girdle muscular dystrophy, PVD, PE on xarelto, depression, viral hepatitis C, and dysphagiaadmitted on 6/25/2018with shortness of breath. Recent hospitalization from 6/16-6/19 for acute respiratory failure due to poor secretion clearance. Discharged to SNF with orders for HS BiPAP but there was complications with BiPAP at the facility. In ED, patient placed on BiPAP. Patient with acute on chronic respiratory failure in the setting of progressive muscular dystrophy. PRN and HS BiPAP. Receiving chest physical therapy, nebs, flonase, robitussin, and robinul. Palliative Medicine consulted for [place]. Since initial consultation with palliative medicine on 12/07/2016 patient has undergone an elective trach as well as PEG tube placement. He has been unable  to wean from the ventilator. Plan was for patient to go to [facility] but on 12/17/2016 family requested to speak to  critical care medicine regarding changes to goals of care, comfort care.  Met with patient and patient's wife, family to discuss withdrawal of ventilator support on 12/19/2016 as well as 12/18/2016  Recommendations/Plan:  Anticipate patient will be ready for withdrawal of ventilator support within the next 24 hours. When patient or spouse indicates that they are ready , recommend the following: Utilize "withdrawal of life-sustaining treatment order set"; initiate morphine continuous infusion at 1 mg an hour basal rate, 2-4 mg every 15 min as needed for pain or shortness of breath; after 2 consecutive PRN doses, increase  Basal infusion rate by 1 mg an hour, not to exceed 10 mg an hour basal rate.  Start Versed continuous infusion at 1 mg an hour basal rate, 2-4 mg every 15 minutes as needed for sedation, respiratory distress; after 2 consecutive as needed doses given, increase basal infusion rate by 1 mg an hour not to exceed 8 mg an hour nasal rate  When patient is sedated, call respiratory therapy to withdraw ventilator and attach humidified air to trach collar  When patient is comfortable, and it seems as though we are at a stable basal rate to ensure comfort, family would like to be transferred to [address] for end-of-life care  Recommend stopping tube feedings when drips are hung, and KVO fluids to avoid volume overload which would worsen shortness of breath  Goals of Care and Additional Recommendations:  Limitations on Scope of Treatment: Avoid Hospitalization, Minimize Medications, No Artificial Feeding, No Blood Transfusions, No Chemotherapy, No Diagnostics, No Glucose Monitoring, No Hemodialysis, No IV Antibiotics, No Lab Draws, No Radiation and No Surgical Procedures  Code Status:    Code Status Orders        Start     Ordered   12/18/16 1741  Do not attempt resuscitation (DNR)  Continuous    Question Answer Comment  In the event of cardiac or respiratory ARREST Do not call a  "code blue"   In the event of cardiac or respiratory ARREST Do not perform Intubation, CPR, defibrillation or ACLS   In the event of cardiac or respiratory ARREST Use medication by any route, position, wound care, and other measures to relive pain and suffering. May use oxygen, suction and manual treatment of airway obstruction as needed for comfort.      12/18/16 1740    Code Status History    Date Active Date Inactive Code Status Order ID Comments User Context   12/04/2016  7:07 PM 12/18/2016  5:40 PM Full Code [id number]  Bufford Lope, DO Inpatient   11/27/2016  4:51 PM 11/30/2016  7:32 PM Full Code [id number]  Rogue Bussing, MD ED       Prognosis:   Hours - Days  Discharge Planning:  Anticipated Hospital Death  Care plan was discussed with Dr. Shan Levans and bedside RN; Dr. Hilma Favors  Thank you for allowing the Palliative Medicine Team to assist in the care of this patient.   Time In: 1200 Time Out: 1300 Total Time 60 min Prolonged Time Billed  no       Greater than 50%  of this time was spent counseling and coordinating care related to the above assessment and plan.  Dory Horn, NP  Please contact Palliative Medicine Team phone at [phone] for questions and concerns.

## 2016-12-19 NOTE — Progress Notes (Signed)
Pt states that he is ready to die. Family requesting Morphine and Versed drip ASAP. On call paged. Will continue to monitor.

## 2016-12-19 NOTE — Plan of Care (Signed)
Problem: Health Behavior/Discharge Planning: Goal: Ability to manage health-related needs will improve Outcome: Progressing Patient and family making end of life decisions. Patient contemplating terminal wean.   Problem: Skin Integrity: Goal: Risk for impaired skin integrity will decrease Outcome: Not Progressing Patient does not want to be turned as he feels it is unnecessary considering his plans.

## 2016-12-19 NOTE — Procedures (Signed)
Extubation Procedure Note  Patient Details:   Name: Yonathan Demke DOB: 1956-02-08 MRN: [id number]   Airway Documentation:     Evaluation  O2 sats: currently acceptable Complications: No apparent complications Patient did tolerate procedure well. Bilateral Breath Sounds: Clear, Diminished   No   Pt taken off ventilator per Withdrawal of Life Protocol to 6L/21% humidified air. Rn at bedside. RT will continue to monitor.   Carolan Shiver 12/19/2016, 4:58 PM

## 2016-12-19 NOTE — Progress Notes (Signed)
1mg  Morphine given via IV per request for respiratory relief. Will continue to monitor.

## 2016-12-20 LAB — GLUCOSE, CAPILLARY: GLUCOSE-CAPILLARY: 98 mg/dL (ref 65–99)

## 2016-12-20 MED ORDER — GLYCOPYRROLATE 0.2 MG/ML IJ SOLN
0.2000 mg | Freq: Three times a day (TID) | INTRAMUSCULAR | Status: DC
  Administered 2016-12-20 – 2016-12-21 (×5): 0.2 mg via INTRAVENOUS
  Filled 2016-12-20 (×7): qty 1

## 2016-12-20 MED ORDER — BIOTENE DRY MOUTH MT LIQD: 15.0000 mL | OROMUCOSAL | Status: DC | PRN

## 2016-12-20 MED ORDER — MORPHINE SULFATE (PF) 4 MG/ML IV SOLN
4.0000 mg | INTRAVENOUS | Status: AC
  Administered 2016-12-20: 4 mg via INTRAVENOUS

## 2016-12-20 MED ORDER — POLYVINYL ALCOHOL 1.4 % OP SOLN
1.0000 [drp] | Freq: Four times a day (QID) | OPHTHALMIC | Status: DC | PRN
  Filled 2016-12-20: qty 15

## 2016-12-20 NOTE — Progress Notes (Signed)
CSW was following for SNF placement- now patient full comfort care and plan for hospital death  CSW signing off for SNF placement but available for additional support/assistance if needed  Burna Sis, LCSW Clinical Social Worker [phone]

## 2016-12-20 NOTE — Progress Notes (Signed)
   12/20/16 1020  Clinical Encounter Type  Visited With Patient and family together  Visit Type Other (Comment) ([place] consult)  Spiritual Encounters  Spiritual Needs Emotional  Stress Factors  Patient Stress Factors Major life changes  Family Stress Factors None identified  Introduction to family as Pt intubated. No services required at this time.

## 2016-12-20 NOTE — Progress Notes (Signed)
   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 After much discussion, he ultimately underwent trach placement hoping to improve his QOL and be able to leave the hospital with daytime ATC and qhs vent.  Unfortunately over the last several days it appears this has not been the case and on 7/8 he and his family have chosen to transition to comfort care with no further ventilator support.   Subjective/Overnight:  No acute change.  Comfortable on ATC with morphine gtt.  VITAL SIGNS: Pulse Rate:  [76-101] 76 (07/09 0300) Resp:  [15-16] 16 (07/09 0300) BP: (110)/(76) 110/76 (07/08 1138) SpO2:  [96 %-100 %] 96 % (07/09 0700) FiO2 (%):  [21 %-30 %] 28 % (07/09 0700) Weight:  [61.7 kg (136 lb 0.4 oz)] 61.7 kg (136 lb 0.4 oz) (07/09 0330)  PHYSICAL EXAMINATION: General:  Frail, thin male, NAD  Neuro:   Appears comfortable on morphine gtt  HEENT:  Trach c/d, mm dry  Cardiovascular:  s1s2 rrr Lungs:  resps even non labored on ATC Abdomen:  Soft  Musculoskeletal:  Warm and dry, no edema    Recent Labs Lab 12/16/16 0947 12/17/16 0737 12/18/16 0317  NA 141 135 140  K 3.7 3.6 3.2*  CL 104 105 108  CO2 29 23 26   BUN 5* 9 9  CREATININE <0.30* <0.30* <0.30*  GLUCOSE 125* 98 105*    Recent Labs Lab 12/17/16 0303 12/18/16 0317 12/19/16 0253  HGB 12.3* 12.4* 12.7*  HCT 37.5* 39.0 40.1  WBC 6.3 6.8 7.4  PLT 269 291 319   No results found.  ASSESSMENT / PLAN:  Acute on chronic respiratory failure - r/t muscular dystrophy, mucous plugging.  Now comfort care.   PLAN -  Continue ATC - no further vent  Continue morphine gtt  No further labs or CXR   PCCM will f/u as needed    Dirk Dress,  NP 12/20/2016  10:04 AM Pager: [phone] or [phone]

## 2016-12-20 NOTE — Plan of Care (Signed)
Problem: Health Behavior/Discharge Planning: Goal: Ability to manage health-related needs will improve Outcome: Progressing Time spent talking with family about what to expect and how we will help the patient if his needs change. Discussed medication titration. Family expressed some sadness and stress over how long this is taking. Stated that they were ready for it to all just be over and that he would want the same. Reassured patient's family that we are here to support them during this transition.   Problem: Physical Regulation: Goal: Ability to maintain clinical measurements within normal limits will improve Outcome: Progressing Patient taken off of ventilator for terminal wean during AM shift 7/8. Patient on 6L trach collar and made full comfort care on versed/ morphine drips. Patient tolerating well. No suffering noted. Patient with Stable vital signs and maintaining oxygen saturations without vent.

## 2016-12-20 NOTE — Progress Notes (Signed)
Family Medicine Teaching Service Daily Progress Note Intern Pager: [phone]  Patient name: George Evans Medical record number: [id number] Date of birth: 1956-03-07 Age: 61 y.o. Gender: male  Primary Care Provider: York Spaniel, MD Consultants: CCM, SLP Code Status: Full   Pt Overview and Major Events to Date:  Mr. Dantonio is a 61 yo male presenting with SOB. Patient was admitted to [facility] on 6/25. Has been followed by CCM for care until transfer to our service on 7/3.    Assessment and Plan:  Acute Respiratory failure 2/2 mucus plugging  Patient follows w/ Pulmonologist Dr. Isaiah Serge. Presented with IWOB and inability to handle secretions, intermittently requiring BiPAP in ED. Patient was not able to get Trilogy ventilator at SNF and nightly BiPAP has not been functioning appropriately. Patient received tracheostomy placement. SpO2 currently 99% on 6L/min O2. Respiratory rate 20. DG Chest Portable 1 view on 12/14/16 showed haziness on left lung base consistent with atelectasis, pneumonia, and possibly small left pleural effusion with no change in position of trach, improved from previous CXR. DG Chest Portable 1 view on 12/15/16 showed no significant change from 7/3.  On 7/7, patient began mentioning coming off of the ventilator and transitioning to comfort care.  He is tired of his illness and does not want to be ventilator dependent.  On 7/8, he decided to be placed on comfort care at a family meeting with palliative care, and he was transitioned to a morphine and versed drip with boluses for comfort, and his ventilator was removed.  Labs and other interventions have been discontinued.  He currently has a trach collar with humidified air, and he is being kept comfortable until he passes. - continue to ensure comfort   Subjective:  Mr Ricke is surrounded by family and appears comfortable.  His mouth is open and he is unresponsive.  His mother was at bedside and said that he has not moved all  night.  The family feels that "his spirit left him" yesterday evening.  Objective: Pulse Rate:  [76-101] 76 (07/09 0300) Resp:  [14-16] 16 (07/09 0300) BP: (110-121)/(76-91) 110/76 (07/08 1138) SpO2:  [97 %-100 %] 97 % (07/09 0300) FiO2 (%):  [21 %-30 %] 21 % (07/09 0415) Weight:  [136 lb 0.4 oz (61.7 kg)] 136 lb 0.4 oz (61.7 kg) (07/09 0330)   Physical Exam: General: the patient is lying in bed with his eyes closed and his mouth open.  He appears comfortable. Cardiovascular: regular rate, sinus rhythm, no MRG Respiratory: steady, quiet breath sounds bilaterally   Lennox Solders, MD 12/20/2016, 7:07 AM PGY-1, [facility] Family Medicine FPTS Intern pager: [phone], text pages welcome

## 2016-12-20 NOTE — Care Management Note (Addendum)
Case Management Note  Patient Details  Name: Hosam Cabrero MRN: [id number] Date of Birth: 04-30-1956  Subjective/Objective:  From [facility], presented with acute respiratory failure secondary to mucus plugging, family has decided on terminal weaning comfort care on morphine and versed drip, vent removed on trach collar, hospital death expected.   7/10 Letha Cape RN, BSN- discussed in LOS.                  Action/Plan: NCM will follow for dc needs.   Expected Discharge Date:  12/17/16               Expected Discharge Plan:  Skilled Nursing Facility (From [facility])  In-House Referral:  Clinical Social Work  Discharge planning Services  CM Consult  Post Acute Care Choice:    Choice offered to:     DME Arranged:    DME Agency:     HH Arranged:    HH Agency:     Status of Service:  In process, will continue to follow  If discussed at Long Length of Stay Meetings, dates discussed:    Additional Comments:  Leone Haven, RN 12/20/2016, 6:10 PM

## 2016-12-21 DIAGNOSIS — Z515 Encounter for palliative care: Secondary | ICD-10-CM

## 2016-12-21 NOTE — Progress Notes (Signed)
Daily Progress Note   Patient Name: George Evans       Date: 12/21/2016 DOB: 26-Feb-1956  Age: 61 y.o. MRN#: [id number] Attending Physician: Carney Living, MD Primary Care Physician: York Spaniel, MD Admit Date: 12-29-2016  Reason for Consultation/Follow-up: Establishing goals of care and Terminal Care  Subjective: Mr. Heindl is unresponsive. His wife and sister are at bedside.   Length of Stay: 15  Current Medications: Scheduled Meds:  . glycopyrrolate  0.2 mg Intravenous TID    Continuous Infusions: . midazolam (VERSED) infusion 5 mg/hr (12/21/16 0910)  . morphine 5 mg/hr (12/21/16 0908)    PRN Meds: albuterol, antiseptic oral rinse, LORazepam, midazolam, morphine, ondansetron (ZOFRAN) IV, polyvinyl alcohol  Physical Exam  Constitutional: He appears cachectic. He has a sickly appearance.  Cardiovascular: Tachycardia present.   Pulmonary/Chest: No accessory muscle usage. No apnea and no tachypnea. No respiratory distress.  Shallow  Abdominal: Soft. Normal appearance.  Neurological: He is unresponsive.  Nursing note and vitals reviewed.           Vital Signs: BP 110/76   Pulse (!) 109   Temp 98.6 F (37 C) (Oral)   Resp 16   Ht 6' (1.829 m)   Wt 62.7 kg (138 lb 3.7 oz)   SpO2 (!) 87%   BMI 18.75 kg/m  SpO2: SpO2: (!) 87 % O2 Device: O2 Device: Tracheostomy Collar O2 Flow Rate: O2 Flow Rate (L/min): 2 L/min  Intake/output summary:  Intake/Output Summary (Last 24 hours) at 12/21/16 0935 Last data filed at 12/21/16 0715  Gross per 24 hour  Intake           188.96 ml  Output              250 ml  Net           -61.04 ml   LBM: Last BM Date: 12/18/16 Baseline Weight: Weight: 70.3 kg (155 lb) Most recent weight: Weight: 62.7 kg (138 lb 3.7 oz)    Palliative Assessment/Data: 10%    Flowsheet Rows     Most Recent Value  Intake Tab  Referral Department  Hospitalist  Unit at Time of Referral  Intermediate Care Unit  Palliative Care Primary Diagnosis  Pulmonary  Date Notified  12/29/2016  Palliative Care Type  New Palliative care  Reason for  referral  Clarify Goals of Care  Date of Admission  29-Dec-2016  Date first seen by Palliative Care  12/07/16  # of days Palliative referral response time  1 Day(s)  # of days IP prior to Palliative referral  0  Clinical Assessment  Palliative Performance Scale Score  30%  Psychosocial & Spiritual Assessment  Palliative Care Outcomes  Patient/Family meeting held?  Yes  Who was at the meeting?  patient, wife, sister  Palliative Care Outcomes  Clarified goals of care, Counseled regarding hospice, ACP counseling assistance, Provided psychosocial or spiritual support      Patient Active Problem List   Diagnosis Date Noted  . History of ETT   . Acute respiratory distress   . At high risk for aspiration   . Hypokalemia   . Moderate protein-calorie malnutrition (HCC)   . Dysphagia   . Palliative care by specialist   . Goals of care, counseling/discussion   . SOB (shortness of breath)   . Acute neuromuscular respiratory failure (HCC) 12/29/2016  . 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 Aspiration into airway 11/27/2016  . Choking   . Muscular weakness   . Respiratory failure (HCC) 09/14/2016  . 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    Palliative Care Assessment & Plan   HPI: 61 y.o. male  with past medical history of limb-girdle muscular dystrophy, PVD, PE on xarelto, depression, viral hepatitis C, and dysphagia admitted on 12/29/16 with shortness of breath. Recent hospitalization from 6/16-6/19 for acute respiratory failure due to poor secretion clearance. Discharged to SNF with orders  for HS BiPAP but there was complications with BiPAP at the facility. In ED, patient placed on BiPAP. Patient with acute on chronic respiratory failure in the setting of progressive muscular dystrophy. PRN and HS BiPAP. Receiving chest physical therapy, nebs, flonase, robitussin, and robinul. Per PCCM, may need elective PEG placement. Palliative medicine consultation for goals of care.   Assessment: I spoke again with Mr. Valasek family as he was given a bath. They were very concerned as he was opening his eyes and moving his hands and they are concerned about discomfort - bolus given to ensure comfort. They tell me that he had a few more episodes late yesterday evening of more awakening and are concerned - I have increased his morphine and versed from 4 mg/hr to 5 mg/hr at family request to ensure comfort. They are happy with this.   I spoke more with his wife. She is struggling as she did not believe that it would take this long for him to die. The longer they have to wait the more she questions. We were able to discuss this and she knows that comfort care was the only alternative and agrees that he is more comfortable today than he has been in 15 years so she knows that there was really no other option. She is able to tell herself this and remind herself that this is the right decision. She struggles with wondering why he is lingering and "this is not what he would want." We discussed how this is out of our control and we will continue to aggressively treat any signs of discomfort - there is nothing we can do to hasten this process of dying. We acknowledged the difficulty of knowing he will die and just waiting for this to happen is excruciating and that it is only normal to wish for him to finally  be at peace and out of pain. Emotional support provided.   Recommendations/Plan:  Pain/dyspnea: Morphine 5 mg/hr with bolus 2-4 every 15 min prn.   Anxiety: Versed 5 mg/hr and bolus 2-4 every 15 min prn.    Robinul 0.2 mg TID.   Goals of Care and Additional Recommendations:  Limitations on Scope of Treatment: Full Comfort Care  Code Status:  DNR  Prognosis:   Hours - Days  Discharge Planning:  Anticipated Hospital Death. Unable to move d/t high amounts of medication required for comfort.    Thank you for allowing the Palliative Medicine Team to assist in the care of this patient.   Total Time Prolonged Time Billed  no       Greater than 50%  of this time was spent counseling and coordinating care related to the above assessment and plan.  Yong Channel, NP Palliative Medicine Team Pager # [phone] (M-F 8a-5p) Team Phone # [phone] (Nights/Weekends)

## 2016-12-21 NOTE — Plan of Care (Signed)
Problem: Health Behavior/Discharge Planning: Goal: Ability to manage health-related needs will improve Outcome: Progressing Emotional support given to patient family members. Frequent checks to make sure that they are comfortable and the patient does not need increased sedation/ pain medication.

## 2016-12-21 NOTE — Care Management Important Message (Signed)
Important Message  Patient Details  Name: George Evans MRN: [id number] Date of Birth: 1955-12-10   Medicare Important Message Given:  Yes    Deyanira Fesler 12/21/2016, 2:15 PM

## 2016-12-21 NOTE — Progress Notes (Signed)
Palliative NP at bedside; NP increased continuous infusion rates of versed and morphine to 79ml/hr and gave boluses of 2mg  morphine and 2mg  versed. Orders to follow.

## 2016-12-21 NOTE — Progress Notes (Signed)
Family Medicine Teaching Service Daily Progress Note Intern Pager: [phone]  Patient name: George Evans Medical record number: [id number] Date of birth: 12/06/1955 Age: 61 y.o. Gender: male  Primary Care Provider: York Spaniel, MD Consultants: CCM, SLP Code Status: Full   Pt Overview and Major Events to Date:  [name] is a 61 yo male presenting with SOB. Patient was admitted to [facility] on 6/25. Has been followed by CCM for care until transfer to our service on 7/3.    Assessment and Plan:  Acute Respiratory failure 2/2 mucus plugging  Patient follows w/ Pulmonologist Dr. Isaiah Serge. Presented with IWOB and inability to handle secretions, intermittently requiring BiPAP in ED. Patient was not able to get Trilogy ventilator at SNF and nightly BiPAP has not been functioning appropriately. Patient received tracheostomy placement. SpO2 currently 99% on 6L/min O2. Respiratory rate 20. DG Chest Portable 1 view on 12/14/16 showed haziness on left lung base consistent with atelectasis, pneumonia, and possibly small left pleural effusion with no change in position of trach, improved from previous CXR. DG Chest Portable 1 view on 12/15/16 showed no significant change from 7/3.  On 7/7, patient began mentioning coming off of the ventilator and transitioning to comfort care.  He is tired of his illness and does not want to be ventilator dependent.  On 7/8, he decided to be placed on comfort care at a family meeting with palliative care, and he was transitioned to a morphine and versed drip with boluses for comfort, and his ventilator was removed.  Labs and other interventions have been discontinued.  He currently has a trach collar with humidified air, and he is being kept comfortable until he passes.  Received one bolus of versed overnight in addition to morphine and versed infusions. - continue to ensure comfort   Subjective:  [name] is surrounded by family and appears comfortable.  His mouth is open and  he is unresponsive. Hi ssister and wife were at bedside this am and said that he was responsive overnight, nodding when asked if he could hear his wife's voice and squeezing her hand.  He received one bolus of 4 mg versed this am.    Objective: Pulse Rate:  [81-104] 104 (07/10 0354) Resp:  [17-19] 17 (07/10 0354) SpO2:  [87 %-100 %] 87 % (07/10 0354) FiO2 (%):  [21 %] 21 % (07/10 0354) Weight:  [138 lb 3.7 oz (62.7 kg)] 138 lb 3.7 oz (62.7 kg) (07/10 0402)   Physical Exam: General: the patient is lying in bed with his eyes closed and his mouth open.  He appears comfortable. Cardiovascular: tachycardic, sinus rhythm, no MRG Respiratory: steady, quiet breath sounds bilaterally   Lennox Solders, MD 12/21/2016, 7:11 AM PGY-1, [facility] Family Medicine FPTS Intern pager: [phone], text pages welcome

## 2016-12-22 DIAGNOSIS — Z515 Encounter for palliative care: Secondary | ICD-10-CM

## 2017-01-12 NOTE — Death Summary Note (Signed)
Family Medicine Teaching [facility] Death Summary  Patient name: George Evans Medical record number: [id number] Date of birth: 1956/01/14 Age: 61 y.o. Gender: male Date of Admission: 12/09/16  Date of Death: 25-Dec-2016 Admitting Physician: Carney Living, MD  Primary Care Provider: York Spaniel, MD Consultants: Palliative Care  Indication for Hospitalization: Respiratory distress due to mucus plugging and Bipap failure at SNF.   Brief Hospital Course:  Rodric Hamper was a 61 year old male with a history of limb girdle muscular dystrophy who was admitted on December 09, 2016 for respiratory distress following bipap failure at his SNF.  He elected to have a PEG tube inserted and a tracheostomy performed to see if these would improve his quality of life.  The PEG tube was inserted on 6/28 and tracheostomy was performed on 6/30.  Mr. Chilcote tolerated these procedures well, but he continued to experience mucus plugging and decided to transition to comfort care because he did not want to become fully ventilator dependent.  On 12/19/2016, Mr. Dosch elected to remove the ventilator and be kept comfortable on morphine and versed after consulting with his family, his primary team, and the palliative care team.  This transitioned occurred on 12/19/2016, and he was kept comfortable until he passed away on December 25, 2016.  Significant Procedures: tracheostomy, PEG insertion   Lennox Solders, MD 12/24/2016, 9:52 PM PGY-1, [facility] Family Medicine

## 2017-01-12 NOTE — Progress Notes (Signed)
Called in to room by Yong Channel, NP for palliative team. Mr. Sadlowski died [phone] am. Asystole, no heart sounds, no breath sounds. Family at bedside. Emotional support provided.

## 2017-01-12 NOTE — Progress Notes (Signed)
Family Medicine Teaching Service Daily Progress Note Intern Pager: [phone]  Patient name: George Evans Medical record number: [id number] Date of birth: 11-21-1955 Age: 61 y.o. Gender: male  Primary Care Provider: York Spaniel, MD Consultants: CCM, SLP Code Status: Full   Pt Overview and Major Events to Date:  George Evans is a 61 yo male presenting with SOB. Patient was admitted to [facility] on 6/25. Has been followed by CCM for care until transfer to our service on 7/3.    Assessment and Plan:  Acute Respiratory failure 2/2 mucus plugging  Patient follows w/ Pulmonologist Dr. Isaiah Serge. Presented with IWOB and inability to handle secretions, intermittently requiring BiPAP in ED. Patient was not able to get Trilogy ventilator at SNF and nightly BiPAP has not been functioning appropriately. Patient received tracheostomy placement. SpO2 currently 99% on 6L/min O2. Respiratory rate 20. DG Chest Portable 1 view on 12/14/16 showed haziness on left lung base consistent with atelectasis, pneumonia, and possibly small left pleural effusion with no change in position of trach, improved from previous CXR. DG Chest Portable 1 view on 12/15/16 showed no significant change from 7/3.  On 7/7, patient began mentioning coming off of the ventilator and transitioning to comfort care.  He is tired of his illness and does not want to be ventilator dependent.  On 7/8, he decided to be placed on comfort care at a family meeting with palliative care, and he was transitioned to a morphine and versed drip with boluses for comfort, and his ventilator was removed.  Labs and other interventions have been discontinued.  He currently has a trach collar with humidified air, and he is being kept comfortable until he passes.  Received three boluses of versed and four boluses of morphine on 7/10 in addition to morphine and versed infusions.  No boluses required so far on 7/11. - continue to ensure comfort   Subjective:  George Evans is  surrounded by family and appears comfortable.  His mouth is open and he is unresponsive.   Objective: No vitals recorded in past 24 hours  Physical Exam: General: the patient is lying in bed with his eyes closed and his mouth open.  He appears comfortable. Cardiovascular: tachycardic, sinus rhythm, no MRG Respiratory: steady, quiet breath sounds bilaterally   Lennox Solders, MD 12/28/2016, 7:00 AM PGY-1, [facility] Family Medicine FPTS Intern pager: [phone], text pages welcome

## 2017-01-12 NOTE — Progress Notes (Signed)
Palliative:  I met today at Mr. Guthmiller bedside. Discussed signs of progressing EOL that we are seeing. Educated that I would not be surprised if he were to pass away today. Wife is ready, she does not wish for him to continue as he is now. As discussing with family at bedside Mr. Arkwright died at 0910 am. Asystole, no heart sounds, no breath sounds. Told family at bedside that he has passed. Emotional support provided to family.   67 min  Vinie Sill, NP Palliative Medicine Team Pager # [phone] (M-F 8a-5p) Team Phone # [phone] (Nights/Weekends)

## 2017-01-12 DEATH — deceased

## 2017-01-25 ENCOUNTER — Ambulatory Visit: Payer: Medicare Other | Admitting: Pulmonary Disease

## 2017-02-24 ENCOUNTER — Ambulatory Visit: Payer: Medicare Other | Admitting: Neurology

## 2017-11-09 IMAGING — DX DG CHEST 1V
1 series · 1 of 1 positions shown · non-contrast
Comparison: Chest radiograph 11/27/2016

CLINICAL DATA: Shortness of breath and cough

EXAM:
CHEST 1 VIEW

[x chest ap]
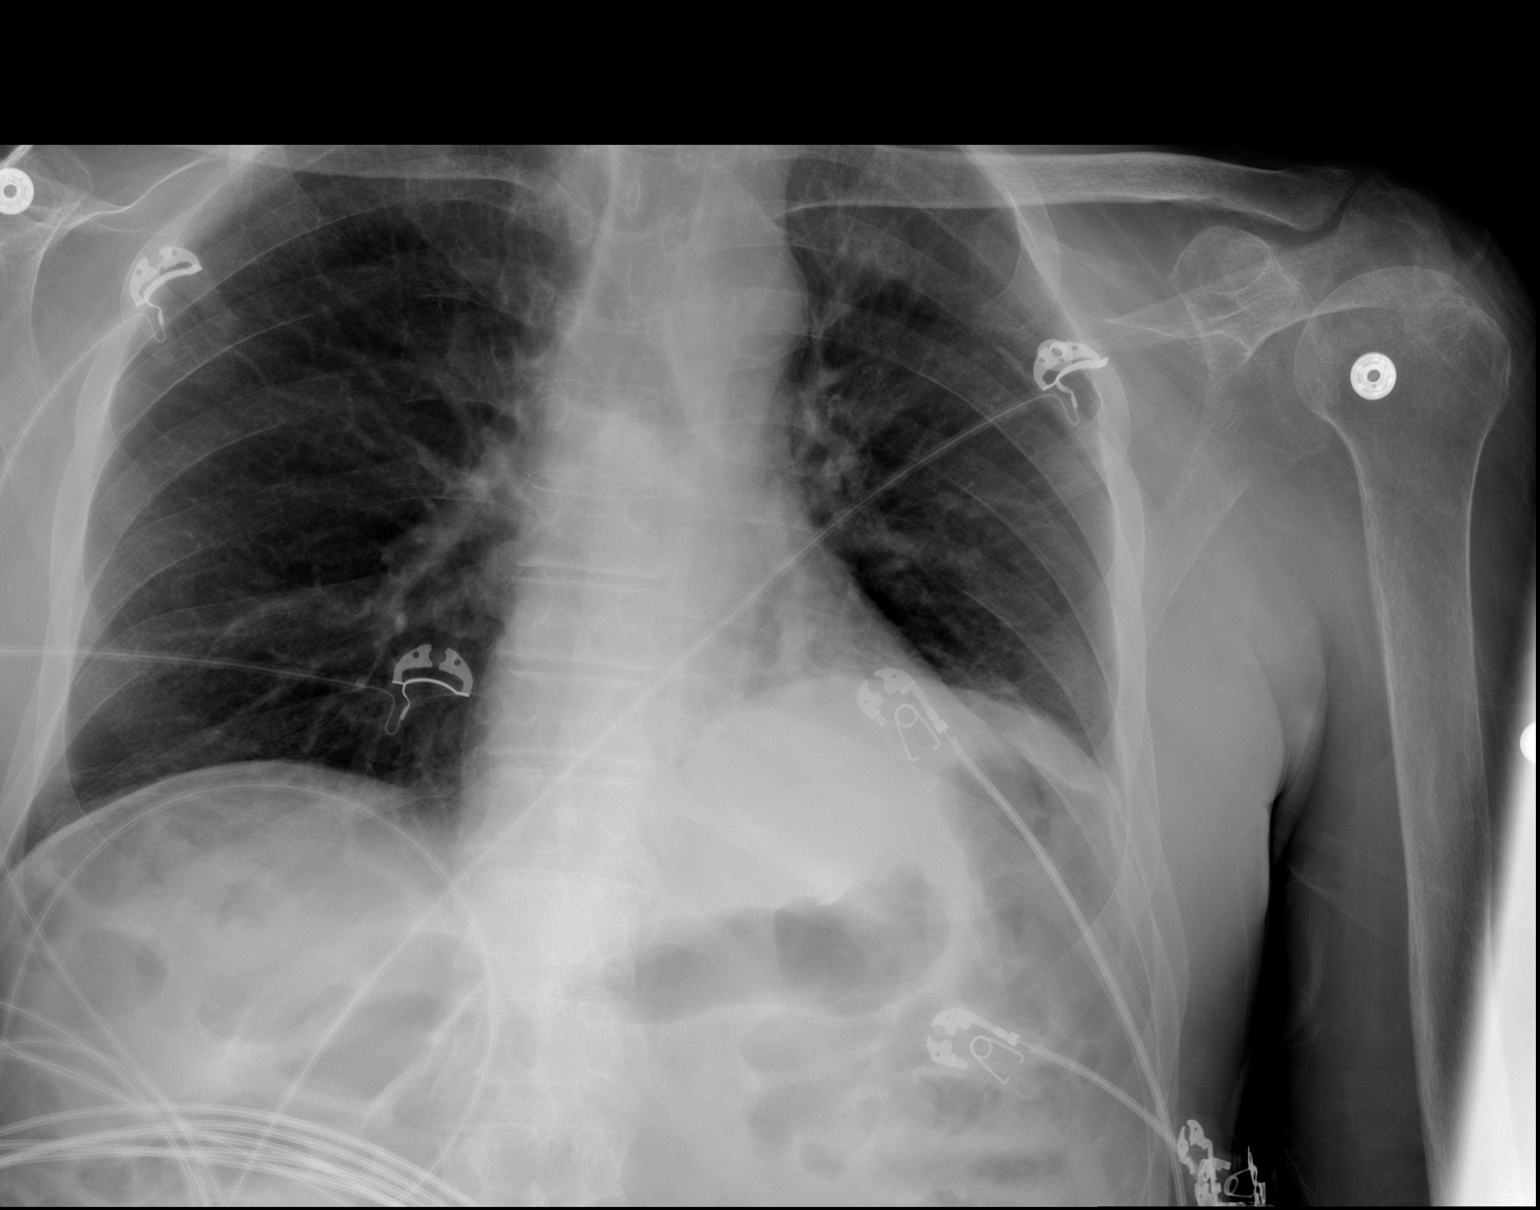

[1 of 1 positions shown; findings below may reference images not displayed]

FINDINGS: Unchanged elevation of the left hemidiaphragm with associated left
basilar atelectasis. Lungs are otherwise clear. Normal
cardiomediastinal contours.
IMPRESSION: Unchanged left hemidiaphragmatic elevation and associated
atelectasis.

## 2017-11-18 IMAGING — DX DG CHEST 1V PORT
1 series · 1 of 1 positions shown · non-contrast
Comparison: 12/14/2016

CLINICAL DATA: Vent dependent

EXAM:
PORTABLE CHEST 1 VIEW

[chest ap]
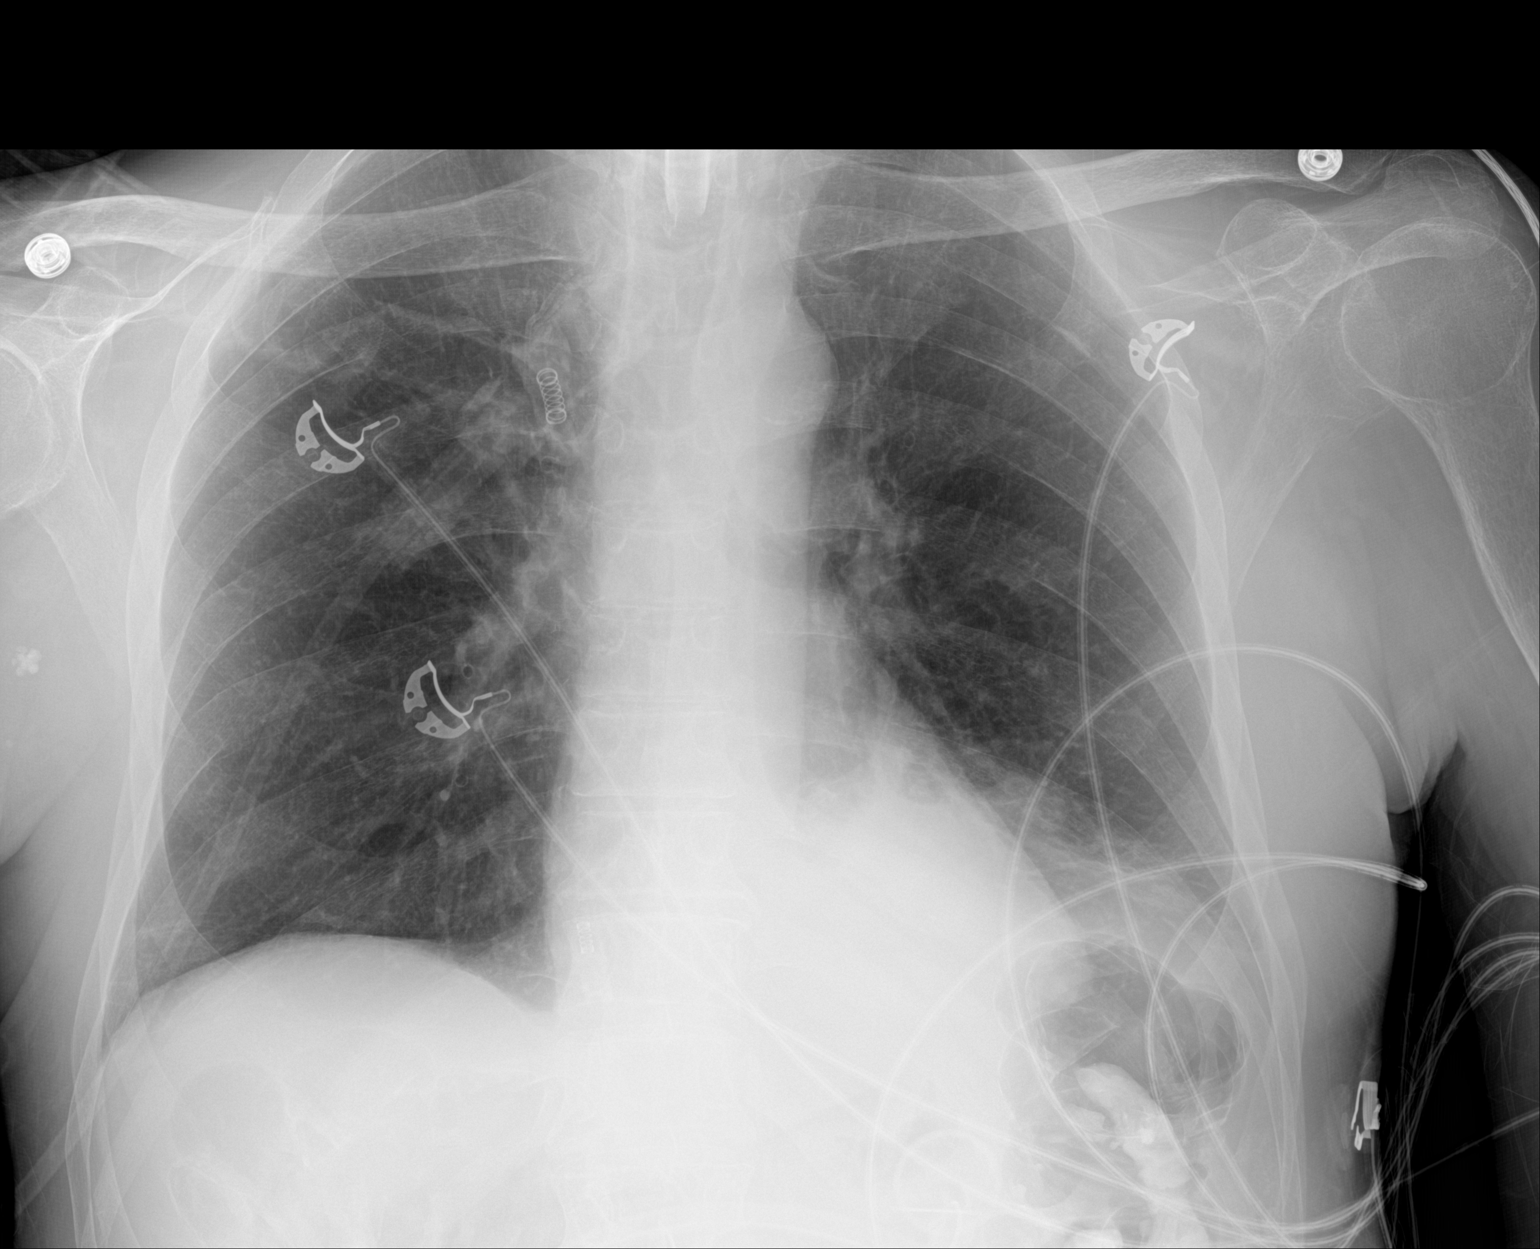

[1 of 1 positions shown; findings below may reference images not displayed]

FINDINGS: Tracheostomy tube is unchanged. Left lower lobe atelectasis or
infiltrate again noted, unchanged. Heart is normal size. Right lung
is clear.
IMPRESSION: Left lower lobe atelectasis or infiltrate, stable.
